# Patient Record
Sex: Female | Born: 1940 | Race: White | Hispanic: No | Marital: Single | Smoking: Former smoker
Health system: Southern US, Community
[De-identification: ages and names within clinical notes are randomized; demographics above are authoritative.]

## PROBLEM LIST (undated history)

## (undated) DIAGNOSIS — Z972 Presence of dental prosthetic device (complete) (partial): Secondary | ICD-10-CM

## (undated) DIAGNOSIS — N183 Chronic kidney disease, stage 3 unspecified: Secondary | ICD-10-CM

## (undated) DIAGNOSIS — I10 Essential (primary) hypertension: Secondary | ICD-10-CM

## (undated) DIAGNOSIS — K439 Ventral hernia without obstruction or gangrene: Secondary | ICD-10-CM

## (undated) DIAGNOSIS — Z9109 Other allergy status, other than to drugs and biological substances: Secondary | ICD-10-CM

## (undated) DIAGNOSIS — E0590 Thyrotoxicosis, unspecified without thyrotoxic crisis or storm: Secondary | ICD-10-CM

## (undated) HISTORY — PX: BLADDER REPAIR: SHX76

## (undated) HISTORY — PX: ABDOMINAL HYSTERECTOMY: SHX81

## (undated) HISTORY — PX: OTHER SURGICAL HISTORY: SHX169

---

## 2014-12-09 ENCOUNTER — Encounter: Payer: Self-pay | Admitting: Emergency Medicine

## 2014-12-09 ENCOUNTER — Ambulatory Visit
Admission: EM | Admit: 2014-12-09 | Discharge: 2014-12-09 | Disposition: A | Discharge status: Home / Self Care | Payer: 59 | Attending: Family Medicine | Admitting: Family Medicine

## 2014-12-09 DIAGNOSIS — E860 Dehydration: Secondary | ICD-10-CM | POA: Diagnosis not present

## 2014-12-09 DIAGNOSIS — N390 Urinary tract infection, site not specified: Secondary | ICD-10-CM

## 2014-12-09 DIAGNOSIS — K529 Noninfective gastroenteritis and colitis, unspecified: Secondary | ICD-10-CM

## 2014-12-09 HISTORY — DX: Other allergy status, other than to drugs and biological substances: Z91.09

## 2014-12-09 HISTORY — DX: Ventral hernia without obstruction or gangrene: K43.9

## 2014-12-09 LAB — URINALYSIS COMPLETE WITH MICROSCOPIC (ARMC ONLY)
GLUCOSE, UA: NEGATIVE mg/dL
NITRITE: NEGATIVE
Protein, ur: 100 mg/dL — AB
SQUAMOUS EPITHELIAL / LPF: NONE SEEN — AB
Specific Gravity, Urine: 1.025 (ref 1.005–1.030)
pH: 5.5 (ref 5.0–8.0)

## 2014-12-09 MED ORDER — BISMUTH SUBSALICYLATE 262 MG/15ML PO SUSP: 30.0000 mL | Freq: Four times a day (QID) | ORAL | Status: DC | PRN

## 2014-12-09 MED ORDER — NITROFURANTOIN MONOHYD MACRO 100 MG PO CAPS: 100.0000 mg | ORAL_CAPSULE | Freq: Two times a day (BID) | ORAL | Status: DC

## 2014-12-09 NOTE — ED Notes (Signed)
Pt reports she feels a little better after drinking water, provided pedialyte and crackers for PO challenge. Pt tolerating drinking fluids.

## 2014-12-09 NOTE — ED Notes (Signed)
Pt provided 1st and 2nd cup of water to drink.

## 2014-12-09 NOTE — ED Notes (Signed)
Pt reports thinks ate bad food, stomach pain, vomiting on Friday. Today off and on low grade fever, no energy, hasn't eaten, "feels sick".

## 2014-12-09 NOTE — Discharge Instructions (Signed)
Asymptomatic Bacteriuria Asymptomatic bacteriuria is the presence of a large number of bacteria in your urine without the usual symptoms of burning or frequent urination. The following conditions increase the risk of asymptomatic bacteriuria:  Diabetes mellitus.  Advanced age.  Pregnancy in the first trimester.  Kidney stones.  Kidney transplants.  Leaky kidney tube valve in young children (reflux). Treatment for this condition is not needed in most people and can lead to other problems such as too much yeast and growth of resistant bacteria. However, some people, such as pregnant women, do need treatment to prevent kidney infection. Asymptomatic bacteriuria in pregnancy is also associated with fetal growth restriction, premature labor, and newborn death. HOME CARE INSTRUCTIONS Monitor your condition for any changes. The following actions may help to relieve any discomfort you are feeling:  Drink enough water and fluids to keep your urine clear or pale yellow. Go to the bathroom more often to keep your bladder empty.  Keep the area around your vagina and rectum clean. Wipe yourself from front to back after urinating. SEEK IMMEDIATE MEDICAL CARE IF:  You develop signs of an infection such as:  Burning with urination.  Frequency of voiding.  Back pain.  Fever.  You have blood in the urine.  You develop a fever. MAKE SURE YOU:  Understand these instructions.  Will watch your condition.  Will get help right away if you are not doing well or get worse. Document Released: 03/29/2005 Document Revised: 08/13/2013 Document Reviewed: 09/18/2012 [facility] Patient Information 2015 [place], [place]. This information is not intended to replace advice given to you by your health care provider. Make sure you discuss any questions you have with your health care provider. Food Poisoning Food poisoning is an illness caused by something you ate or drank. There are over 250 known causes of  food poisoning. However, many other causes are unknown.You can be treated even if the exact cause of your food poisoning is not known. In most cases, food poisoning is mild and lasts 1 to 2 days. However, some cases can be serious, especially for people with low immune systems, the elderly, children and infants, and pregnant women. CAUSES  Poor personal hygiene, improper cleaning of storage and preparation areas, and unclean utensils can cause infection or tainting (contamination) of foods. The causes of food poisoning are numerous.Infectious agents, such as viruses, bacteria, or parasites, can cause harm by infecting the intestine and disrupting the absorption of nutrients and water. This can cause diarrhea and lead to dehydration. Viruses are responsible for most of the food poisonings in which an agent is found. Parasites are less likely to cause food poisoning. Toxic agents, such as poisonous mushrooms, marine algae, and pesticides can also cause food poisoning.  Viral causes of food poisoning include:  Norovirus.  Rotavirus.  Hepatitis A.  Bacterial causes of food poisoning include:  Salmonellae.  Campylobacter.  Bacillus cereus.  Escherichia coli (E. coli).  Shigella.  Listeria monocytogenes.  Clostridium botulinum (botulism).  Vibrio cholerae.  Parasites that can cause food poisoning include:  Giardia.  Cryptosporidium.  Toxoplasma. SYMPTOMS Symptoms may appear several hours or longer after consuming the contaminated food or drink. Symptoms may include:  Nausea.  Vomiting.  Cramping.  Diarrhea.  Fever and chills.  Muscle aches. DIAGNOSIS Your health care provider may be able to diagnose food poisoning from a list of what you have recently eaten and results from lab tests. Diagnostic tests may include an exam of the feces. TREATMENT In most cases, treatment  focuses on helping to relieve your symptoms and staying well hydrated. Antibiotic medicines are  rarely needed. In severe cases, hospitalization may be required. HOME CARE INSTRUCTIONS   Drink enough water and fluids to keep your urine clear or pale yellow. Drink small amounts of fluids frequently and increase as tolerated.  Ask your health care provider for specific rehydration instructions.  Avoid:  Foods high in sugar.  Alcohol.  Carbonated drinks.  Tobacco.  Juice.  Caffeine drinks.  Extremely hot or cold fluids.  Fatty, greasy foods.  Too much intake of anything at one time.  Dairy products until 24 to 48 hours after diarrhea stops.  You may consume probiotics. Probiotics are active cultures of beneficial bacteria. They may lessen the amount and number of diarrheal stools in adults. Probiotics can be found in yogurt with active cultures and in supplements.  Wash your hands well to avoid spreading the bacteria.  Take medicines only as directed by your health care provider. Do not give your child aspirin because of the association with Reye's syndrome.  Ask your health care provider if you should continue to take your regular prescribed and over-the-counter medicines. PREVENTION   Wash your hands, food preparation surfaces, and utensils thoroughly before and after handling raw foods.  Keep refrigerated foods below 39F (5C).  Serve hot foods immediately or keep them heated above 139F (60C).  Divide large volumes of food into small portions for rapid cooling in the refrigerator. Hot, bulky foods in the refrigerator can raise the temperature of other foods that have already cooled.  Follow approved canning procedures.  Heat canned foods thoroughly before tasting.  When in doubt, throw it out.  Infants, the elderly, women who are pregnant, and people with compromised immune systems are especially susceptible to food poisoning. These people should never consume unpasteurized cheese, unpasteurized cider, raw fish, raw seafood, or raw meat-type products. SEEK  IMMEDIATE MEDICAL CARE IF:   You have difficulty breathing, swallowing, talking, or moving.  You develop blurred vision.  You are unable to keep fluids down.  You faint or nearly faint.  Your eyes turn yellow.  Vomiting or diarrhea develops or becomes persistent.  Abdominal pain develops, increases, or localizes in one small area.  You have a fever.  The diarrhea becomes excessive or contains blood or mucus.  You develop excessive weakness, dizziness, or extreme thirst.  You have no urine for 8 hours. MAKE SURE YOU:   Understand these instructions.  Will watch your condition.  Will get help right away if you are not doing well or get worse. Document Released: 12/26/2003 Document Revised: 08/13/2013 Document Reviewed: 08/13/2010 [facility] Patient Information 2015 [place], [place]. This information is not intended to replace advice given to you by your health care provider. Make sure you discuss any questions you have with your health care provider. Viral Gastroenteritis Viral gastroenteritis is also known as stomach flu. This condition affects the stomach and intestinal tract. It can cause sudden diarrhea and vomiting. The illness typically lasts 3 to 8 days. Most people develop an immune response that eventually gets rid of the virus. While this natural response develops, the virus can make you quite ill. CAUSES  Many different viruses can cause gastroenteritis, such as rotavirus or noroviruses. You can catch one of these viruses by consuming contaminated food or water. You may also catch a virus by sharing utensils or other personal items with an infected person or by touching a contaminated surface. SYMPTOMS  The most common symptoms  are diarrhea and vomiting. These problems can cause a severe loss of body fluids (dehydration) and a body salt (electrolyte) imbalance. Other symptoms may include:  Fever.  Headache.  Fatigue.  Abdominal pain. DIAGNOSIS  Your caregiver can  usually diagnose viral gastroenteritis based on your symptoms and a physical exam. A stool sample may also be taken to test for the presence of viruses or other infections. TREATMENT  This illness typically goes away on its own. Treatments are aimed at rehydration. The most serious cases of viral gastroenteritis involve vomiting so severely that you are not able to keep fluids down. In these cases, fluids must be given through an intravenous line (IV). HOME CARE INSTRUCTIONS   Drink enough fluids to keep your urine clear or pale yellow. Drink small amounts of fluids frequently and increase the amounts as tolerated.  Ask your caregiver for specific rehydration instructions.  Avoid:  Foods high in sugar.  Alcohol.  Carbonated drinks.  Tobacco.  Juice.  Caffeine drinks.  Extremely hot or cold fluids.  Fatty, greasy foods.  Too much intake of anything at one time.  Dairy products until 24 to 48 hours after diarrhea stops.  You may consume probiotics. Probiotics are active cultures of beneficial bacteria. They may lessen the amount and number of diarrheal stools in adults. Probiotics can be found in yogurt with active cultures and in supplements.  Wash your hands well to avoid spreading the virus.  Only take over-the-counter or prescription medicines for pain, discomfort, or fever as directed by your caregiver. Do not give aspirin to children. Antidiarrheal medicines are not recommended.  Ask your caregiver if you should continue to take your regular prescribed and over-the-counter medicines.  Keep all follow-up appointments as directed by your caregiver. SEEK IMMEDIATE MEDICAL CARE IF:   You are unable to keep fluids down.  You do not urinate at least once every 6 to 8 hours.  You develop shortness of breath.  You notice blood in your stool or vomit. This may look like coffee grounds.  You have abdominal pain that increases or is concentrated in one small area  (localized).  You have persistent vomiting or diarrhea.  You have a fever.  The patient is a child younger than 3 months, and he or she has a fever.  The patient is a child older than 3 months, and he or she has a fever and persistent symptoms.  The patient is a child older than 3 months, and he or she has a fever and symptoms suddenly get worse.  The patient is a baby, and he or she has no tears when crying. MAKE SURE YOU:   Understand these instructions.  Will watch your condition.  Will get help right away if you are not doing well or get worse. Document Released: 03/29/2005 Document Revised: 06/21/2011 Document Reviewed: 01/13/2011 [facility] Patient Information 2015 [place], [place]. This information is not intended to replace advice given to you by your health care provider. Make sure you discuss any questions you have with your health care provider.

## 2014-12-09 NOTE — ED Provider Notes (Signed)
CSN: [id number]     Arrival date & time 12/09/14  0900 History   First MD Initiated Contact with Patient 12/09/14 1021     No chief complaint on file.  (Consider location/radiation/quality/duration/timing/severity/associated sxs/prior Treatment) HPI Comments: Single caucasian female here for evaluation of diarrhea over weekend, dizziness, decreased appetite, no energy fever tmax 101.9 over the weekend.  On Friday 26 August ate a healthy choice frozen meal she has eaten in the past without problems and for lunch had salad of lettuce, tomato, chicken and cucumber at 1730 started to feel sick to stomach 2130 pains only tolerated liquids all weekend bought ensure Sunday 28 Aug and was able to tolerate it but still dizzy and decreased appetite today.  Temperature fluctuates between 101 and 98 to 99.  Body aches, sweats, chills and year-round allergies.  Currently no PCM as previous one [place].  The history is provided by the patient.    Past Medical History  Diagnosis Date  . Environmental allergies   . Hernia of abdominal wall    Past Surgical History  Procedure Laterality Date  . Cesarean section    . Large bowel surgery    . Bladder repair     History reviewed. No pertinent family history. Social History  Substance Use Topics  . Smoking status: Former Smoker    Quit date: 12/08/1973  . Smokeless tobacco: None  . Alcohol Use: No   OB History    No data available     Review of Systems  Constitutional: Positive for fever, chills, diaphoresis, activity change, appetite change and fatigue.  HENT: Negative for congestion, dental problem, drooling, ear discharge, ear pain, facial swelling, hearing loss, mouth sores, nosebleeds, postnasal drip, rhinorrhea, sinus pressure, sneezing, sore throat, tinnitus, trouble swallowing and voice change.   Eyes: Negative for photophobia, pain, discharge, redness, itching and visual disturbance.  Respiratory: Negative for cough,  chest tightness, shortness of breath, wheezing and stridor.   Cardiovascular: Negative for chest pain, palpitations and leg swelling.  Gastrointestinal: Negative for nausea, vomiting, abdominal pain, diarrhea, constipation, blood in stool and abdominal distention.  Endocrine: Negative for cold intolerance and heat intolerance.  Genitourinary: Negative for dysuria, frequency and hematuria.  Musculoskeletal: Positive for myalgias. Negative for back pain, joint swelling, arthralgias, gait problem, neck pain and neck stiffness.  Skin: Negative for color change, pallor, rash and wound.  Allergic/Immunologic: Positive for environmental allergies and food allergies. Negative for immunocompromised state.  Neurological: Positive for dizziness and weakness. Negative for tremors, seizures, syncope, facial asymmetry, speech difficulty, light-headedness, numbness and headaches.  Hematological: Negative for adenopathy. Does not bruise/bleed easily.  Psychiatric/Behavioral: Negative for behavioral problems, confusion, sleep disturbance and agitation.    Allergies  Fish allergy  Home Medications   Prior to Admission medications   Medication Sig Start Date End Date Taking? Authorizing Provider  bismuth subsalicylate (PEPTO-BISMOL) 262 MG/15ML suspension Take 30 mLs by mouth every 6 (six) hours as needed. 12/09/14   Barbaraann Barthel, NP  nitrofurantoin, macrocrystal-monohydrate, (MACROBID) 100 MG capsule Take 1 capsule (100 mg total) by mouth 2 (two) times daily. 12/09/14   Barbaraann Barthel, NP   Meds Ordered and Administered this Visit  Medications - No data to display  BP 133/68 mmHg  Pulse 94  Temp(Src) 98.2 F (36.8 C) (Oral)  Resp 16  Ht 5\' 1"  (1.549 m)  Wt 130 lb (58.968 kg)  BMI 24.58 kg/m2  SpO2 98% Orthostatic VS for the past 24 hrs:  BP- Lying  Pulse- Lying BP- Sitting Pulse- Sitting BP- Standing at 0 minutes Pulse- Standing at 0 minutes  12/09/14 1021 144/66 mmHg 86 119/88 mmHg 93 120/67  mmHg 101    Physical Exam  Constitutional: She is oriented to person, place, and time. Vital signs are normal. She appears well-developed and well-nourished. No distress.  HENT:  Head: Normocephalic and atraumatic.  Right Ear: External ear normal.  Left Ear: External ear normal.  Nose: Nose normal.  Mouth/Throat: Oropharynx is clear and moist. No oropharyngeal exudate.  Eyes: Conjunctivae, EOM and lids are normal. Pupils are equal, round, and reactive to light. Right eye exhibits no discharge. Left eye exhibits no discharge. No scleral icterus.  Neck: Trachea normal and normal range of motion. Neck supple. No tracheal deviation present.  Cardiovascular: Normal rate, regular rhythm, normal heart sounds and intact distal pulses.  Exam reveals no gallop and no friction rub.   No murmur heard. Pulmonary/Chest: Effort normal and breath sounds normal. No stridor. No respiratory distress. She has no wheezes. She has no rales.  Abdominal: Soft. Bowel sounds are normal. She exhibits no shifting dullness, no distension, no pulsatile liver, no fluid wave, no abdominal bruit, no ascites and no mass. There is no hepatosplenomegaly. There is no tenderness. There is no rigidity, no rebound, no guarding, no tenderness at McBurney's point and negative Murphy's sign. Hernia confirmed negative in the ventral area.  Dull to percussion x 4 quads  Musculoskeletal: Normal range of motion. She exhibits no edema or tenderness.  Lymphadenopathy:    She has no cervical adenopathy.  Neurological: She is alert and oriented to person, place, and time. She exhibits normal muscle tone. Coordination normal.  Skin: Skin is warm and intact. No rash noted. She is not diaphoretic. No erythema. No pallor.  Psychiatric: She has a normal mood and affect. Her speech is normal and behavior is normal. Judgment and thought content normal. Cognition and memory are normal.  Nursing note and vitals reviewed.   ED Course  Procedures  (including critical care time)  Labs Review Labs Reviewed  URINALYSIS COMPLETEWITH MICROSCOPIC ([facility] ONLY) - Abnormal; Notable for the following:    Color, Urine AMBER (*)    APPearance CLOUDY (*)    Bilirubin Urine 1+ (*)    Ketones, ur TRACE (*)    Hgb urine dipstick 2+ (*)    Protein, ur 100 (*)    Leukocytes, UA 2+ (*)    Bacteria, UA FEW (*)    Squamous Epithelial / LPF NONE SEEN (*)    All other components within normal limits  URINE CULTURE    Imaging Review No results found.  1030 patient tolerated cup of water without nausea and vomiting, dizzy with position changes.  1125 patient notified urinalysis results UTI  Given copy of results.  Tolerated pedialyte in clinic.  Rx for macrobid 100mg  po BID given. Discussed urine culture results typically available in 48 hours.  Bland diet for gastroenteritis.  PO hydration resolved orthostatic hypotension. See repeat vitals.   Patient verbalized understanding of information/instructions, agreed with plan of care and had no further questions at this time.  Orthostatic VS for the past 24 hrs:  BP- Lying Pulse- Lying BP- Sitting Pulse- Sitting BP- Standing at 0 minutes Pulse- Standing at 0 minutes  12/09/14 1131 123/52 mmHg 77 123/55 mmHg 83 111/58 mmHg 85  12/09/14 1021 144/66 mmHg 86 119/88 mmHg 93 120/67 mmHg 101      MDM   1. UTI (lower urinary tract infection)  2. Gastroenteritis   3. Mild dehydration    Medications as directed. Rx macrobid  po BID x 7 days.  Patient is also to push fluids Will call with urine culture results once available typically 48 hours.  Hydrate, avoid dehydration.  Avoid holding urine void on frequent basis every 4 to 6 hours.  If unable to void every 8 hours follow up for re-evaluation with PCM, urgent care or ER.   Call or return to clinic as needed if these symptoms worsen or fail to improve as anticipated.  Exitcare handout on cystitis given to patient Patient verbalized agreement and  understanding of treatment plan and had no further questions at this time. P2:  Hydrate and cranberry juice  I have recommended clear fluids and the BRAT diet. May use OTC peptobismol if diarrhea reoccurs.  Discussed will cause stools to become black.  Avoid dairy, meat, fried and spicy foods until symptoms resolved.  Offered nausea medication and patient refused.  medications as directed.  Return to the clinic if  symptoms persist or worsen; I have alerted the patient to call if high fever, unable to urinate every 8 hours, dehydration, marked weakness, fainting, increased abdominal pain, blood in stool or vomit.  Exitcare handout on gastroenteritis, food poisoning given to patient.  Patient verbalized agreement and understanding of treatment plan and had no further questions at this time.   P2:  Hand washing and fitness  Barbaraann Barthel, NP 12/09/14 1528

## 2014-12-11 LAB — URINE CULTURE: Special Requests: NORMAL

## 2020-12-26 ENCOUNTER — Other Ambulatory Visit: Payer: Self-pay

## 2020-12-26 ENCOUNTER — Ambulatory Visit
Admission: EM | Admit: 2020-12-26 | Discharge: 2020-12-26 | Disposition: A | Discharge status: Home / Self Care | Payer: Medicare Other

## 2020-12-26 DIAGNOSIS — R21 Rash and other nonspecific skin eruption: Secondary | ICD-10-CM | POA: Diagnosis not present

## 2020-12-26 DIAGNOSIS — L299 Pruritus, unspecified: Secondary | ICD-10-CM | POA: Diagnosis not present

## 2020-12-26 DIAGNOSIS — W57XXXA Bitten or stung by nonvenomous insect and other nonvenomous arthropods, initial encounter: Secondary | ICD-10-CM

## 2020-12-26 MED ORDER — TRIAMCINOLONE ACETONIDE 0.1 % EX CREA
1.0000 | TOPICAL_CREAM | Freq: Two times a day (BID) | CUTANEOUS | 0 refills | Status: AC
Start: 2020-12-26 — End: ?

## 2020-12-26 MED ORDER — METHYLPREDNISOLONE 4 MG PO TBPK: ORAL_TABLET | ORAL | 0 refills | Status: DC

## 2020-12-26 NOTE — ED Provider Notes (Signed)
[facility]    CSN: [id number] Arrival date & time: 12/26/20  2706      History   Chief Complaint No chief complaint on file.   HPI Amber Pollard is a 80 y.o. female presenting for 3-day history of erythematous and pruritic papules of her extremities.  Patient says she went out of town to the mountains over the weekend and the rash developed shortly after she returned home.  She says she did sleep in a cabin out there.  Patient reports she continues to wake up with new bumps.  She says that she woke up today with new red bumps on her left upper arm.  She has applied alcohol and hydrocortisone cream without any improvement in condition.  Patient says it continues to get worse and spread.  She is not member come in contact with any known allergens and denies seeing any insects, specifically bedbugs but does have concern for that.  No other contacts have similar rashes.  Patient says she has washed her clothes that she wore when she was out of the mountains but has not washed her pillows.  No other complaints.  HPI  Past Medical History:  Diagnosis Date   Environmental allergies    Hernia of abdominal wall     There are no problems to display for this patient.   Past Surgical History:  Procedure Laterality Date   BLADDER REPAIR     CESAREAN SECTION     large bowel surgery      OB History   No obstetric history on file.      Home Medications    Prior to Admission medications   Medication Sig Start Date End Date Taking? Authorizing Provider  Ascorbic Acid 500 MG CHEW Chew by mouth.   Yes [provider]  bismuth subsalicylate (PEPTO-BISMOL) 262 MG/15ML suspension Take 30 mLs by mouth every 6 (six) hours as needed. 12/09/14  Yes Betancourt, Jarold Song, NP  cyanocobalamin 100 MCG tablet Take 1 tablet by mouth daily.   Yes [provider]  methimazole (TAPAZOLE) 5 MG tablet Take 2.5 mg by mouth daily. 10/23/20  Yes [provider]   methylPREDNISolone (MEDROL DOSEPAK) 4 MG TBPK tablet Take 6 tabs PO on day 1 and decrease by 1 tab daily until complete 12/26/20  Yes Eusebio Friendly B, PA-C  montelukast (SINGULAIR) 10 MG tablet Take 10 mg by mouth daily. 10/23/20  Yes [provider]  Multiple Vitamins-Minerals (MULTIVITAMIN ADULT EXTRA C PO) Take 1 tablet by mouth daily.   Yes [provider]  nitrofurantoin, macrocrystal-monohydrate, (MACROBID) 100 MG capsule Take 1 capsule (100 mg total) by mouth 2 (two) times daily. 12/09/14  Yes Betancourt, Jarold Song, NP  triamcinolone cream (KENALOG) 0.1 % Apply 1 application topically 2 (two) times daily. 12/26/20  Yes Shirlee Latch PA-C    Family History History reviewed. No pertinent family history.  Social History Social History   Tobacco Use   Smoking status: Former    Types: Cigarettes    Quit date: 12/08/1973    Years since quitting: 47.0   Smokeless tobacco: Never  Substance Use Topics   Alcohol use: No     Allergies   Fish allergy   Review of Systems Review of Systems  Constitutional:  Negative for fatigue and fever.  HENT:  Negative for congestion.   Respiratory:  Negative for cough.   Musculoskeletal:  Negative for arthralgias and joint swelling.  Skin:  Positive for rash.  Allergic/Immunologic: Positive  for environmental allergies.  Neurological:  Negative for weakness.    Physical Exam Triage Vital Signs ED Triage Vitals  Enc Vitals Group     BP 12/26/20 0955 (!) 154/69     Pulse Rate 12/26/20 0955 83     Resp 12/26/20 0955 18     Temp 12/26/20 0955 98.1 F (36.7 C)     Temp Source 12/26/20 0955 Oral     SpO2 12/26/20 0955 96 %     Weight 12/26/20 0952 135 lb (61.2 kg)     Height 12/26/20 0952 5\' 1"  (1.549 m)     Head Circumference --      Peak Flow --      Pain Score 12/26/20 0951 0     Pain Loc --      Pain Edu? --      Excl. in GC? --    No data found.  Updated Vital Signs BP (!) 154/69 (BP Location: Left Arm)   Pulse  83   Temp 98.1 F (36.7 C) (Oral)   Resp 18   Ht 5\' 1"  (1.549 m)   Wt 135 lb (61.2 kg)   SpO2 96%   BMI 25.51 kg/m      Physical Exam Vitals and nursing note reviewed.  Constitutional:      General: She is not in acute distress.    Appearance: Normal appearance. She is not ill-appearing or toxic-appearing.  HENT:     Head: Normocephalic and atraumatic.  Eyes:     General: No scleral icterus.       Right eye: No discharge.        Left eye: No discharge.     Conjunctiva/sclera: Conjunctivae normal.  Cardiovascular:     Rate and Rhythm: Normal rate and regular rhythm.     Heart sounds: Normal heart sounds.  Pulmonary:     Effort: Pulmonary effort is normal. No respiratory distress.     Breath sounds: Normal breath sounds.  Musculoskeletal:     Cervical back: Neck supple.  Skin:    General: Skin is dry.     Findings: Rash (small and larger erythematous papules diffusely on upper and lower extremities) present.  Neurological:     General: No focal deficit present.     Mental Status: She is alert. Mental status is at baseline.     Motor: No weakness.     Gait: Gait normal.  Psychiatric:        Mood and Affect: Mood normal.        Behavior: Behavior normal.        Thought Content: Thought content normal.     UC Treatments / Results  Labs (all labs ordered are listed, but only abnormal results are displayed) Labs Reviewed - No data to display  EKG   Radiology No results found.  Procedures Procedures (including critical care time)  Medications Ordered in UC Medications - No data to display  Initial Impression / Assessment and Plan / UC Course  I have reviewed the triage vital signs and the nursing notes.  Pertinent labs & imaging results that were available during my care of the patient were reviewed by me and considered in my medical decision making (see chart for details).   80 y/o female presents for erythematous papular, pruritic rash x 3 days. Her  presentation today is consistent with insect bite/sting. High suspicion for bed bugs. Reviewed with patient that we can treat with topical and oral corticosteroids. Claritin non drowsy  during day and Benadryl at night for pruritis. Also reviewed cleaning all clothes, sheets, pillows in high heat or discarding them. If continued, new areas of rash, advised she call an exterminator.   Final Clinical Impressions(s) / UC Diagnoses   Final diagnoses:  Rash and nonspecific skin eruption  Insect bite, unspecified site, initial encounter  Pruritus     Discharge Instructions      -Your rash today does appear to be most consistent with bedbugs.  As we discussed you likely have brought bedbug eggs or some of the bugs back with you.  I would advise you either cleaning all of your sheets and close and pillows on high heat a couple of times or throwing them out.  I have sent in a couple medications to help but I would advise you take Claritin nondrowsy during the day for itching and Benadryl at bedtime.  You get those medications over-the-counter.  If you continue to have new bites then I will contact an exterminator.     ED Prescriptions     Medication Sig Dispense Auth. Provider   methylPREDNISolone (MEDROL DOSEPAK) 4 MG TBPK tablet Take 6 tabs PO on day 1 and decrease by 1 tab daily until complete 21 tablet Eusebio Friendly B, PA-C   triamcinolone cream (KENALOG) 0.1 % Apply 1 application topically 2 (two) times daily. 30 g Gareth Morgan      PDMP not reviewed this encounter.   Shirlee Latch, PA-C 12/26/20 1038

## 2020-12-26 NOTE — ED Triage Notes (Signed)
Patient came in today c/o rashes all over her body states she was in the mountains this weekend.has tried hydrocortisone  cream. With little relief after cream. -MB

## 2020-12-26 NOTE — Discharge Instructions (Addendum)
-  Your rash today does appear to be most consistent with bedbugs.  As we discussed you likely have brought bedbug eggs or some of the bugs back with you.  I would advise you either cleaning all of your sheets and close and pillows on high heat a couple of times or throwing them out.  I have sent in a couple medications to help but I would advise you take Claritin nondrowsy during the day for itching and Benadryl at bedtime.  You get those medications over-the-counter.  If you continue to have new bites then I will contact an exterminator.

## 2021-01-30 ENCOUNTER — Ambulatory Visit
Admission: EM | Admit: 2021-01-30 | Discharge: 2021-01-30 | Disposition: A | Discharge status: Home / Self Care | Payer: Medicare Other | Attending: Emergency Medicine | Admitting: Emergency Medicine

## 2021-01-30 ENCOUNTER — Other Ambulatory Visit: Payer: Self-pay

## 2021-01-30 ENCOUNTER — Telehealth: Payer: Self-pay | Admitting: Emergency Medicine

## 2021-01-30 DIAGNOSIS — M7712 Lateral epicondylitis, left elbow: Secondary | ICD-10-CM

## 2021-01-30 MED ORDER — MELOXICAM 7.5 MG PO TABS: 7.5000 mg | ORAL_TABLET | Freq: Every day | ORAL | 0 refills | Status: AC

## 2021-01-30 NOTE — ED Provider Notes (Signed)
[facility]  ____________________________________________  Time seen: Approximately 5:35 PM  I have reviewed the triage vital signs and the nursing notes.   HISTORY  Chief Complaint Elbow Pain (left)   Historian Patient     HPI Amber Pollard is a 80 y.o. female presents to the urgent care with left elbow pain.  Patient localizes pain to the lateral aspect of her left elbow.  She reports that pain started after she was playing golf.  She denies numbness or tingling that extends into her hand.  No similar instances of pain in the past.   Past Medical History:  Diagnosis Date   Environmental allergies    Hernia of abdominal wall      Immunizations up to date:  Yes.     Past Medical History:  Diagnosis Date   Environmental allergies    Hernia of abdominal wall     There are no problems to display for this patient.   Past Surgical History:  Procedure Laterality Date   BLADDER REPAIR     CESAREAN SECTION     large bowel surgery      Prior to Admission medications   Medication Sig Start Date End Date Taking? Authorizing Provider  Ascorbic Acid 500 MG CHEW Chew by mouth.   Yes [provider]  cyanocobalamin 100 MCG tablet Take 1 tablet by mouth daily.   Yes [provider]  meloxicam (MOBIC) 7.5 MG tablet Take 1 tablet (7.5 mg total) by mouth daily for 7 days. 01/30/21 02/06/21 Yes Pia Mau M, PA-C  methimazole (TAPAZOLE) 5 MG tablet Take 2.5 mg by mouth daily. 10/23/20  Yes [provider]  montelukast (SINGULAIR) 10 MG tablet Take 10 mg by mouth daily. 10/23/20  Yes [provider]  Multiple Vitamins-Minerals (MULTIVITAMIN ADULT EXTRA C PO) Take 1 tablet by mouth daily.   Yes [provider]  bismuth subsalicylate (PEPTO-BISMOL) 262 MG/15ML suspension Take 30 mLs by mouth every 6 (six) hours as needed. 12/09/14   Betancourt, Jarold Song, NP  methylPREDNISolone (MEDROL DOSEPAK) 4 MG TBPK tablet Take 6 tabs PO  on day 1 and decrease by 1 tab daily until complete 12/26/20   Shirlee Latch, PA-C  nitrofurantoin, macrocrystal-monohydrate, (MACROBID) 100 MG capsule Take 1 capsule (100 mg total) by mouth 2 (two) times daily. 12/09/14   Betancourt, Jarold Song, NP  triamcinolone cream (KENALOG) 0.1 % Apply 1 application topically 2 (two) times daily. 12/26/20   Shirlee Latch, PA-C    Allergies Fish allergy  History reviewed. No pertinent family history.  Social History Social History   Tobacco Use   Smoking status: Former    Types: Cigarettes    Quit date: 12/08/1973    Years since quitting: 47.1   Smokeless tobacco: Never  Substance Use Topics   Alcohol use: No     Review of Systems  Constitutional: No fever/chills Eyes:  No discharge ENT: No upper respiratory complaints. Respiratory: no cough. No SOB/ use of accessory muscles to breath Gastrointestinal:   No nausea, no vomiting.  No diarrhea.  No constipation. Musculoskeletal: Patient has left elbow pain. Skin: Negative for rash, abrasions, lacerations, ecchymosis.    ____________________________________________   PHYSICAL EXAM:  VITAL SIGNS: ED Triage Vitals  Enc Vitals Group     BP 01/30/21 1526 (!) 155/78     Pulse Rate 01/30/21 1526 82     Resp 01/30/21 1526 18     Temp 01/30/21 1526 98.5 F (36.9 C)  Temp Source 01/30/21 1526 Oral     SpO2 01/30/21 1526 98 %     Weight 01/30/21 1525 134 lb (60.8 kg)     Height 01/30/21 1525 5\' 1"  (1.549 m)     Head Circumference --      Peak Flow --      Pain Score 01/30/21 1524 6     Pain Loc --      Pain Edu? --      Excl. in GC? --      Constitutional: Alert and oriented. Well appearing and in no acute distress. Eyes: Conjunctivae are normal. PERRL. EOMI. Head: Atraumatic. ENT: Cardiovascular: Normal rate, regular rhythm. Normal S1 and S2.  Good peripheral circulation. Respiratory: Normal respiratory effort without tachypnea or retractions. Lungs CTAB. Good air entry to the  bases with no decreased or absent breath sounds Gastrointestinal: Bowel sounds x 4 quadrants. Soft and nontender to palpation. No guarding or rigidity. No distention. Musculoskeletal: Full range of motion to all extremities. No obvious deformities noted.  Lateral left elbow pain reproduced with resisted extension at the left wrist. Neurologic:  Normal for age. No gross focal neurologic deficits are appreciated.  Skin:  Skin is warm, dry and intact. No rash noted. Psychiatric: Mood and affect are normal for age. Speech and behavior are normal.   ____________________________________________   LABS (all labs ordered are listed, but only abnormal results are displayed)  Labs Reviewed - No data to display ____________________________________________  EKG   ____________________________________________  RADIOLOGY   No results found.  ____________________________________________    PROCEDURES  Procedure(s) performed:     Procedures     Medications - No data to display   ____________________________________________   INITIAL IMPRESSION / ASSESSMENT AND PLAN / ED COURSE  Pertinent labs & imaging results that were available during my care of the patient were reviewed by me and considered in my medical decision making (see chart for details).    Assessment and plan Lateral epicondylitis 80 year old female presents to the urgent care with acute left elbow pain.  History and physical exam findings consistent with lateral epicondylitis.  An Ace wrap was given for compression and patient was discharged with low-dose meloxicam as patient reports that she can tolerate anti-inflammatories.  Also recommended ice application for inflammation.  She was advised to follow-up with orthopedics as needed.      ____________________________________________  FINAL CLINICAL IMPRESSION(S) / ED DIAGNOSES  Final diagnoses:  Lateral epicondylitis of left elbow      NEW MEDICATIONS  STARTED DURING THIS VISIT:  ED Discharge Orders          Ordered    meloxicam (MOBIC) 7.5 MG tablet  Daily        01/30/21 1617                This chart was dictated using voice recognition software/Dragon. Despite best efforts to proofread, errors can occur which can change the meaning. Any change was purely unintentional.     02/01/21, PA-C 01/30/21 1737

## 2021-01-30 NOTE — ED Triage Notes (Signed)
Pt c/o pain in her left elbow, radiating up to her shoulder. Pt states pain started early yesterday and increased after playing 18 holes of golf. Pt denies any numbness/tingling, normal ROM to the wrist/hand, pain with movement of the elbow and shoulder. Pt denies swelling to the arm, any known injury.

## 2021-01-30 NOTE — Discharge Instructions (Addendum)
Take meloxicam once daily.  You can take anti-inflammatory up to 2 times a day. Apply ice to left elbow. Wear Ace wrap when playing golf for compression.

## 2021-09-29 ENCOUNTER — Emergency Department: Payer: Medicare Other

## 2021-09-29 ENCOUNTER — Other Ambulatory Visit: Payer: Self-pay

## 2021-09-29 ENCOUNTER — Emergency Department
Admission: EM | Admit: 2021-09-29 | Discharge: 2021-09-29 | Disposition: A | Discharge status: Home / Self Care | Payer: Medicare Other | Attending: Emergency Medicine | Admitting: Emergency Medicine

## 2021-09-29 ENCOUNTER — Encounter: Payer: Self-pay | Admitting: Emergency Medicine

## 2021-09-29 DIAGNOSIS — I447 Left bundle-branch block, unspecified: Secondary | ICD-10-CM | POA: Diagnosis not present

## 2021-09-29 DIAGNOSIS — R519 Headache, unspecified: Secondary | ICD-10-CM | POA: Insufficient documentation

## 2021-09-29 DIAGNOSIS — M542 Cervicalgia: Secondary | ICD-10-CM | POA: Insufficient documentation

## 2021-09-29 DIAGNOSIS — R911 Solitary pulmonary nodule: Secondary | ICD-10-CM | POA: Insufficient documentation

## 2021-09-29 DIAGNOSIS — R079 Chest pain, unspecified: Secondary | ICD-10-CM

## 2021-09-29 DIAGNOSIS — R0789 Other chest pain: Secondary | ICD-10-CM | POA: Diagnosis not present

## 2021-09-29 LAB — TROPONIN I (HIGH SENSITIVITY)
Troponin I (High Sensitivity): 5 ng/L (ref <18)
Troponin I (High Sensitivity): 6 ng/L (ref <18)

## 2021-09-29 LAB — CBC WITH DIFFERENTIAL/PLATELET
Abs Immature Granulocytes: 0.04 10*3/uL (ref 0.00–0.07)
Basophils Absolute: 0.1 10*3/uL (ref 0.0–0.1)
Basophils Relative: 0 %
Eosinophils Absolute: 0.2 10*3/uL (ref 0.0–0.5)
Eosinophils Relative: 2 %
HCT: 41.5 % (ref 36.0–46.0)
Hemoglobin: 14 g/dL (ref 12.0–15.0)
Immature Granulocytes: 0 %
Lymphocytes Relative: 9 %
Lymphs Abs: 1 10*3/uL (ref 0.7–4.0)
MCH: 29.4 pg (ref 26.0–34.0)
MCHC: 33.7 g/dL (ref 30.0–36.0)
MCV: 87.2 fL (ref 80.0–100.0)
Monocytes Absolute: 0.8 10*3/uL (ref 0.1–1.0)
Monocytes Relative: 7 %
Neutro Abs: 9.3 10*3/uL — ABNORMAL HIGH (ref 1.7–7.7)
Neutrophils Relative %: 82 %
Platelets: 238 10*3/uL (ref 150–400)
RBC: 4.76 MIL/uL (ref 3.87–5.11)
RDW: 11.9 % (ref 11.5–15.5)
WBC: 11.4 10*3/uL — ABNORMAL HIGH (ref 4.0–10.5)
nRBC: 0 % (ref 0.0–0.2)

## 2021-09-29 LAB — COMPREHENSIVE METABOLIC PANEL
ALT: 21 U/L (ref 0–44)
AST: 26 U/L (ref 15–41)
Albumin: 4.2 g/dL (ref 3.5–5.0)
Alkaline Phosphatase: 86 U/L (ref 38–126)
Anion gap: 7 (ref 5–15)
BUN: 16 mg/dL (ref 8–23)
CO2: 27 mmol/L (ref 22–32)
Calcium: 9.8 mg/dL (ref 8.9–10.3)
Chloride: 102 mmol/L (ref 98–111)
Creatinine, Ser: 0.8 mg/dL (ref 0.44–1.00)
GFR, Estimated: >60 mL/min (ref >60)
Glucose, Bld: 133 mg/dL — ABNORMAL HIGH (ref 70–99)
Potassium: 4.1 mmol/L (ref 3.5–5.1)
Sodium: 136 mmol/L (ref 135–145)
Total Bilirubin: 0.6 mg/dL (ref 0.3–1.2)
Total Protein: 8 g/dL (ref 6.5–8.1)

## 2021-09-29 IMAGING — MR MR MRA NECK WO/W CM
3 of 4 series · 33 of 48 positions shown · IV contrast (6ml Gadavist)
Comparison: None Available.

CLINICAL DATA: Headache, new or worsening (Age >= 50y) severe pain
in head neck and upper chest, worse w/ deep breathing movement.;
Vertebral artery dissection suspected

EXAM:
MRA NECK WITHOUT AND WITH CONTRAST
MRA HEAD WITHOUT CONTRAST
TECHNIQUE: Multiplanar and multiecho pulse sequences of the neck were obtained
without and with intravenous contrast. Angiographic images of the
neck were obtained using MRA technique without and with intravenous
contrast; Angiographic images of the Circle of Willis were obtained
using MRA technique without intravenous contrast.
CONTRAST:  6mL GADAVIST GADOBUTROL 1 MMOL/ML IV SOLN

[Series 11: angio_fl3d_cor_post_ttc=2.0s · coronal · B · 0.9mm · 0.85mm/px · 11 of 96 slices shown]
[im 1/96]
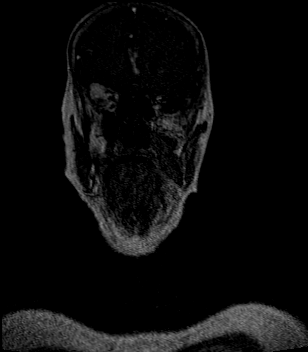
[im 10/96]
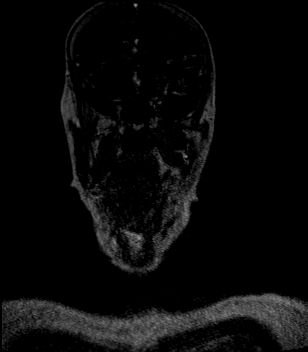
[im 20/96]
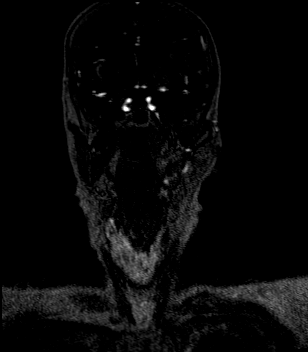
[im 29/96]
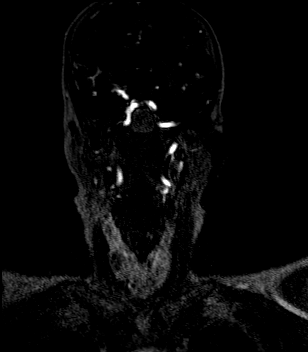
[im 39/96]
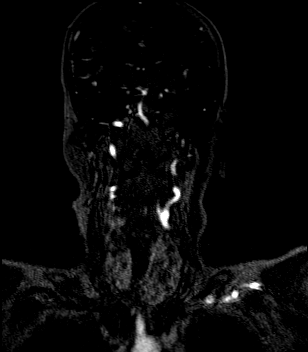
[im 48/96]
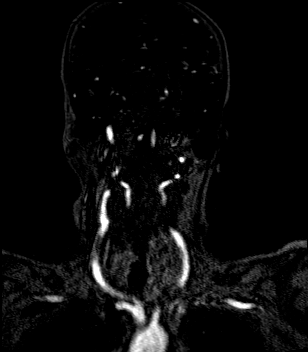
[im 58/96]
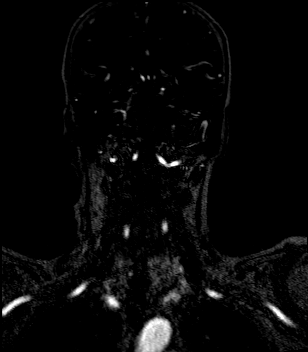
[im 67/96]
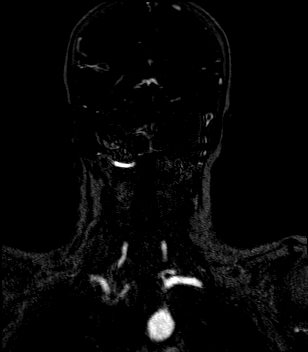
[im 77/96]
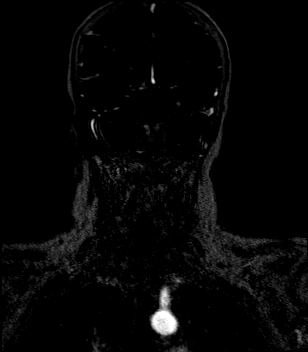
[im 86/96]
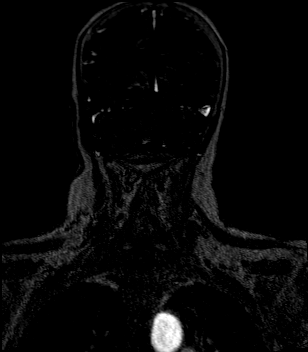
[im 96/96]
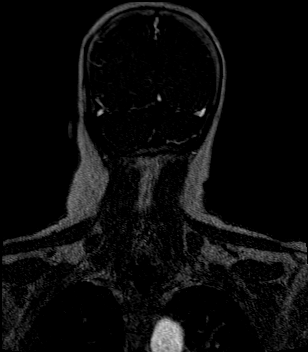

[Series 12: angio_fl3d_cor_post_ttc=2.0s_moco-adv · coronal · B · 0.9mm · 0.85mm/px · 11 of 96 slices shown]
[im 1/96]
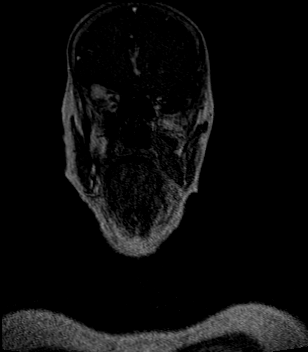
[im 10/96]
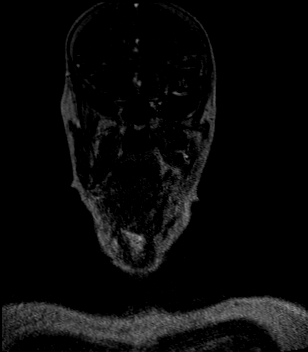
[im 20/96]
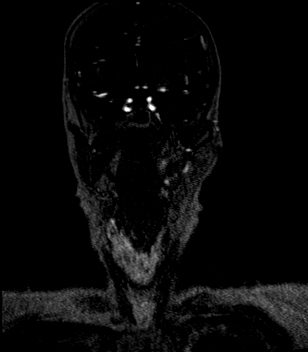
[im 29/96]
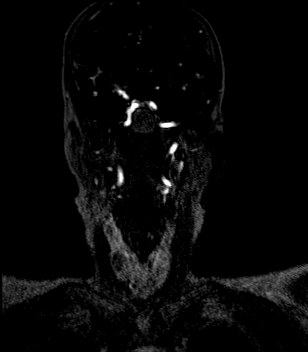
[im 39/96]
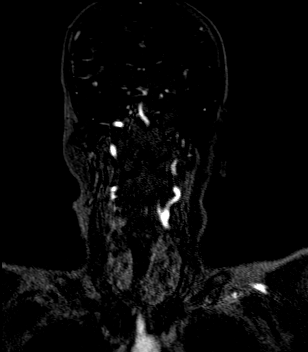
[im 48/96]
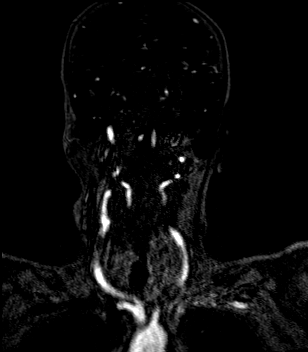
[im 58/96]
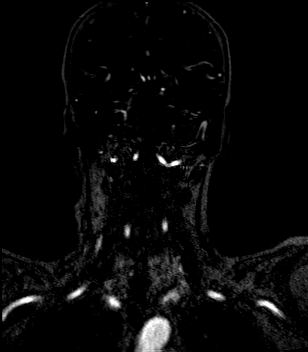
[im 67/96]
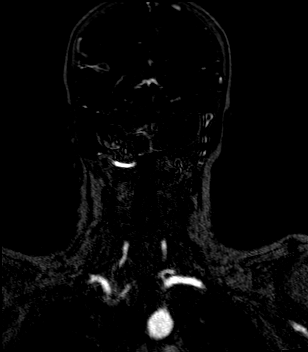
[im 77/96]
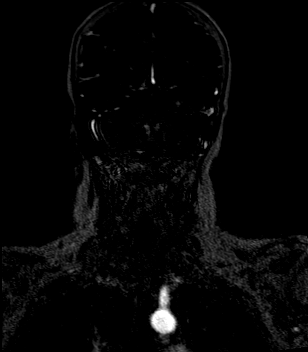
[im 86/96]
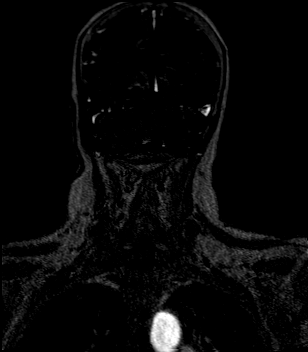
[im 96/96]
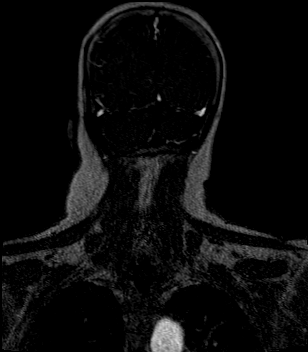

[Series 13: angio_fl3d_cor_post_ttc=2.0s_moco-adv_sub · coronal · B · 0.9mm · 0.85mm/px · 11 of 96 slices shown]
[im 1/96]
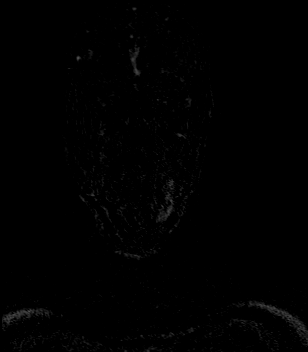
[im 10/96]
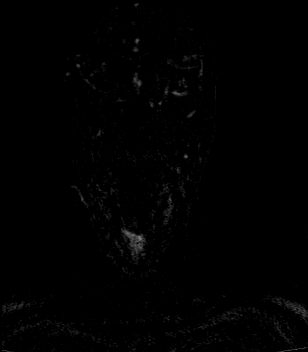
[im 20/96]
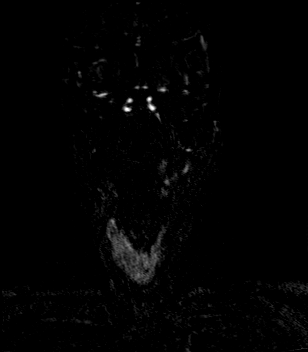
[im 29/96]
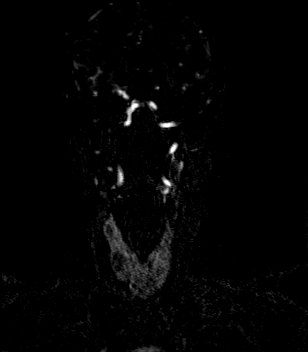
[im 39/96]
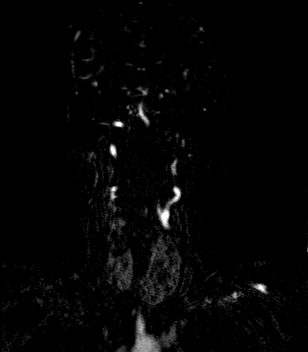
[im 48/96]
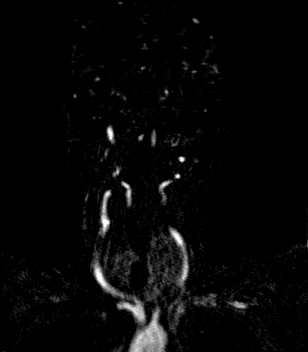
[im 58/96]
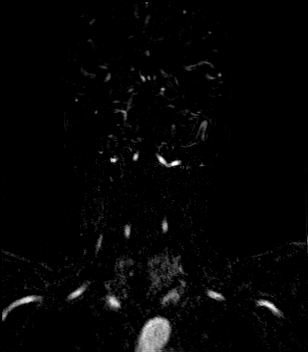
[im 67/96]
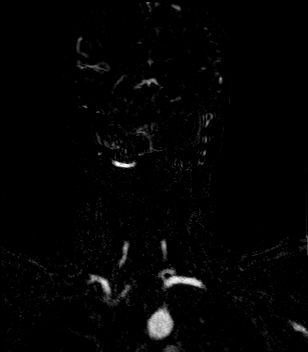
[im 77/96]
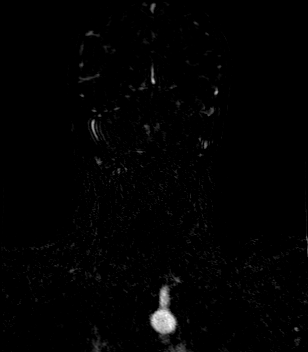
[im 86/96]
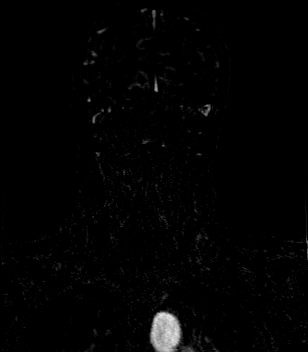
[im 96/96]
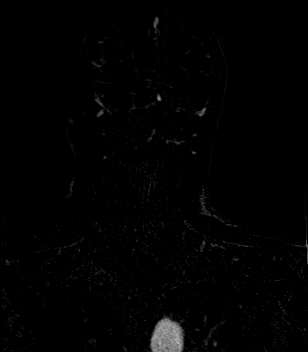

[33 of 48 positions shown; findings below may reference images not displayed]

FINDINGS: MRA NECK FINDINGS

Visualized arch is unremarkable. Great vessel origins are patent.
Common, internal, and external carotid arteries are patent.
Extracranial vertebral arteries are patent. Vertebral origins are
not well evaluated. The vessels are codominant and without stenosis.

MRA HEAD FINDINGS

Intracranial internal carotid arteries are patent with mild
atherosclerotic irregularity. Anterior cerebral arteries are patent.
Anterior communicating artery is present. Middle cerebral arteries
are patent. Intracranial vertebral arteries, basilar artery, and
posterior cerebral arteries are patent. No significant stenosis or
aneurysm identified.
IMPRESSION: No large vessel occlusion, hemodynamically significant stenosis, or
evidence of dissection.

## 2021-09-29 IMAGING — CT CT ANGIO CHEST
2 of 6 series · 17 of 46 positions shown · IV contrast (APPLIED)
Comparison: None Available.

CLINICAL DATA: Chest wall pain

EXAM:
CT ANGIOGRAPHY CHEST WITH CONTRAST
TECHNIQUE: Multidetector CT imaging of the chest was performed using the
standard protocol during bolus administration of intravenous
contrast. Multiplanar CT image reconstructions and MIPs were
obtained to evaluate the vascular anatomy.

[Series 6: thins · axial · 0.69mm/px · z∈[-748,-527]mm · 14 of 345 slices shown]
[im 15/345  lung]
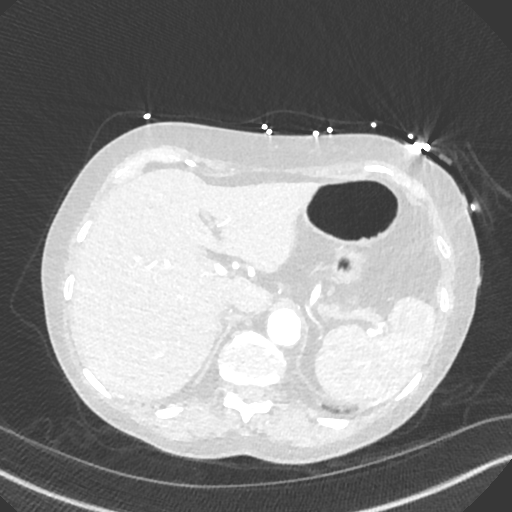
[im 45/345  soft-tissue]
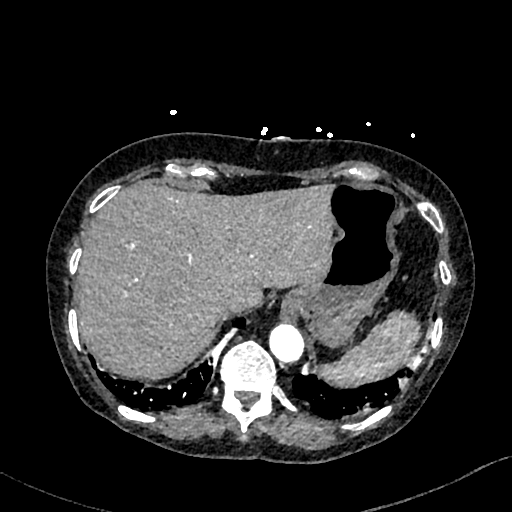
[im 60/345  lung]
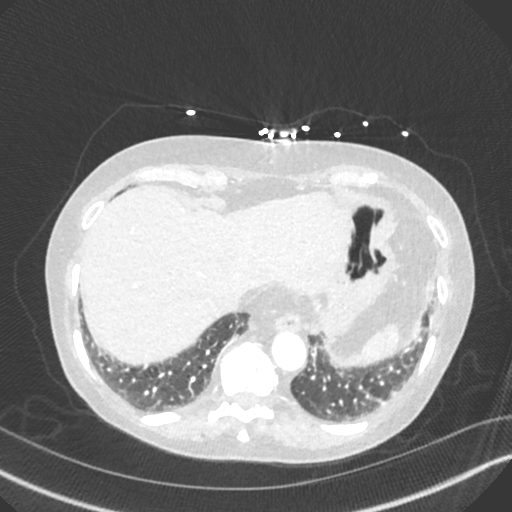
[im 90/345  soft-tissue]
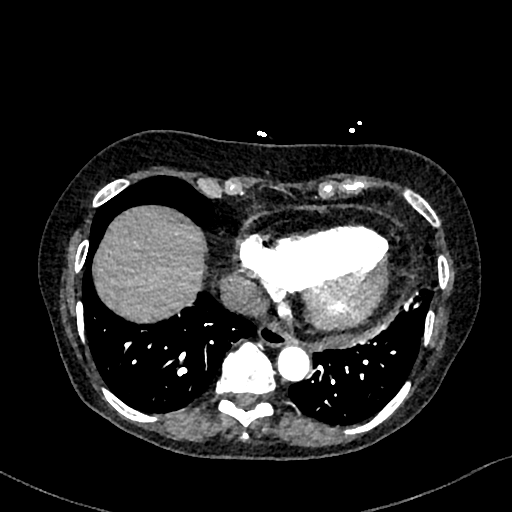
[im 120/345  lung]
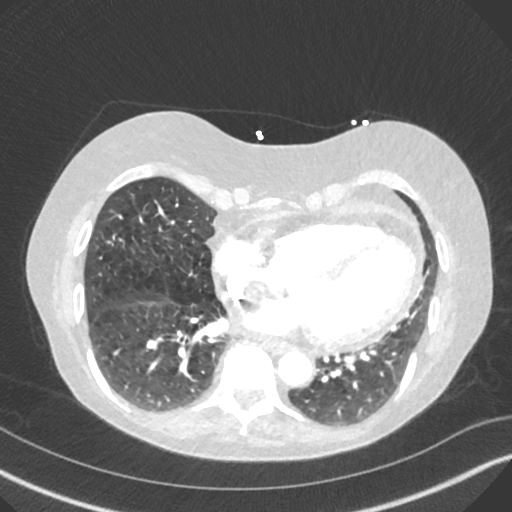
[im 135/345  soft-tissue]
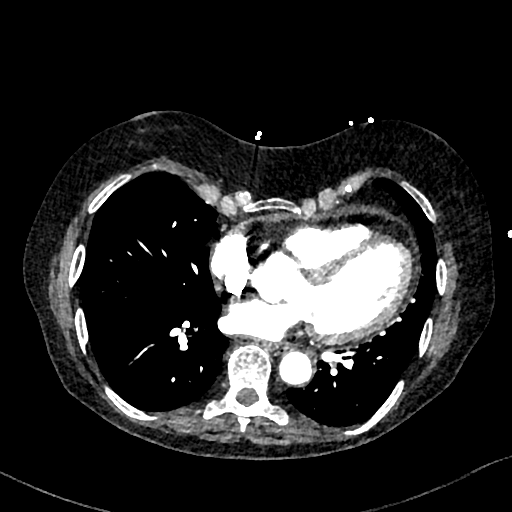
[im 165/345  lung]
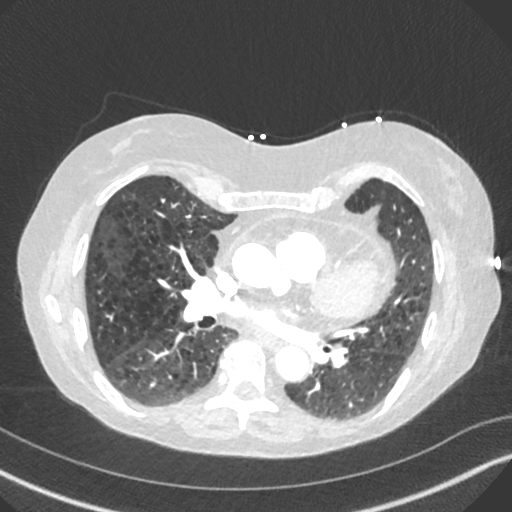
[im 180/345  soft-tissue]
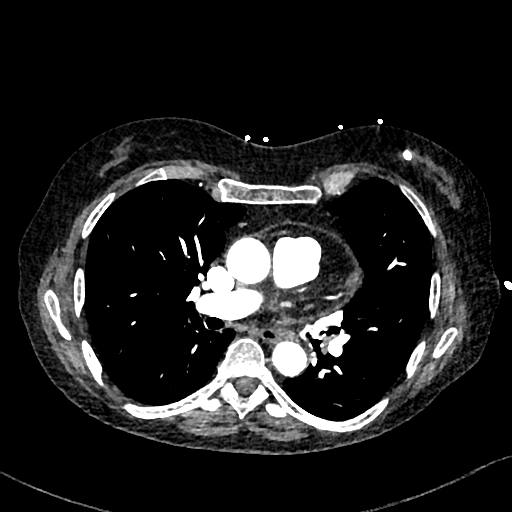
[im 210/345  lung]
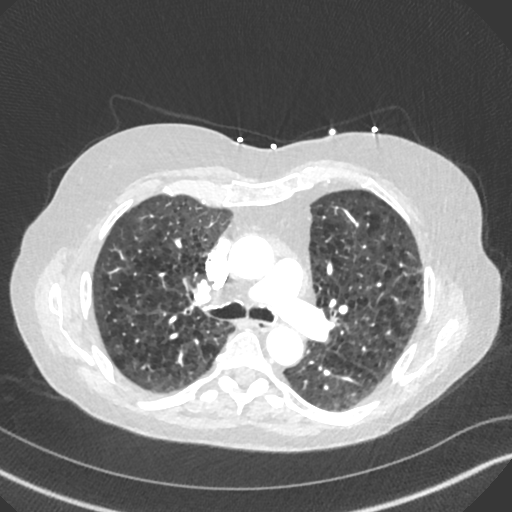
[im 225/345  soft-tissue]
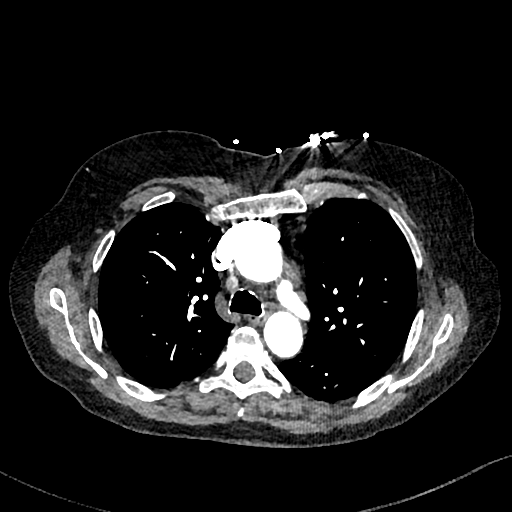
[im 255/345  lung]
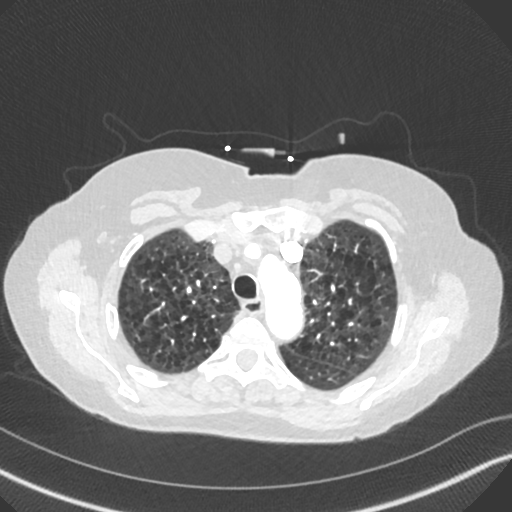
[im 285/345  soft-tissue]
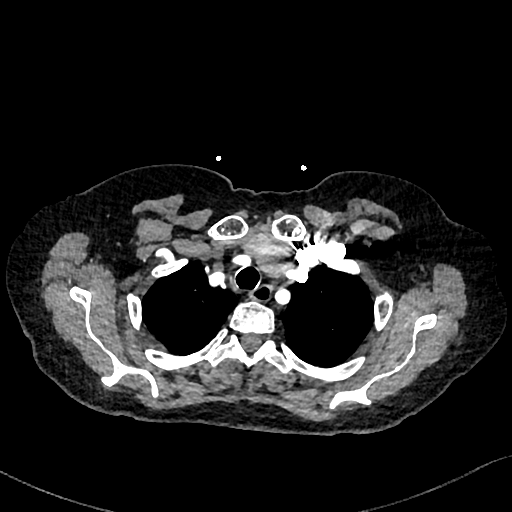
[im 300/345  lung]
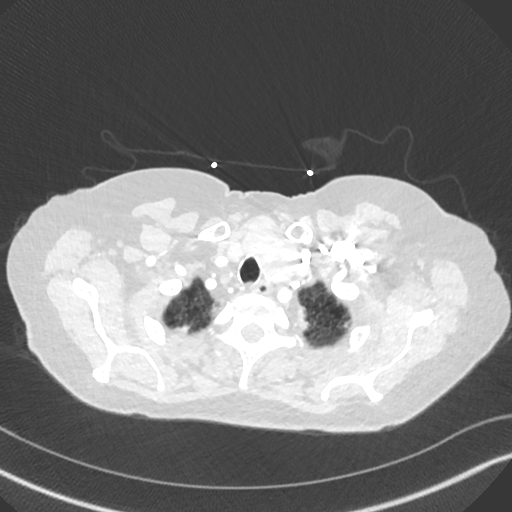
[im 330/345  soft-tissue]
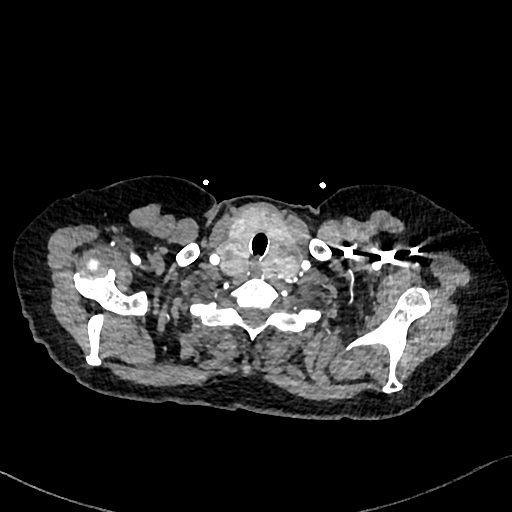

[Series 7: cor · coronal · 0.47mm/px · 3 of 105 slices shown]
[im 27/105  soft-tissue]
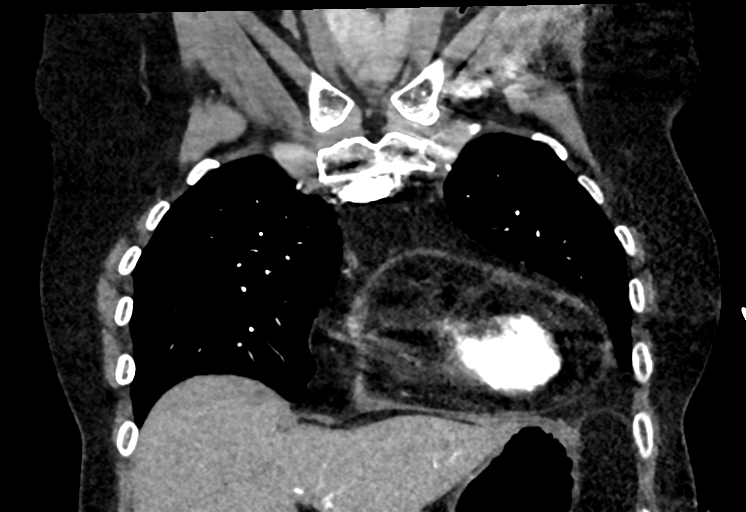
[im 53/105  soft-tissue]
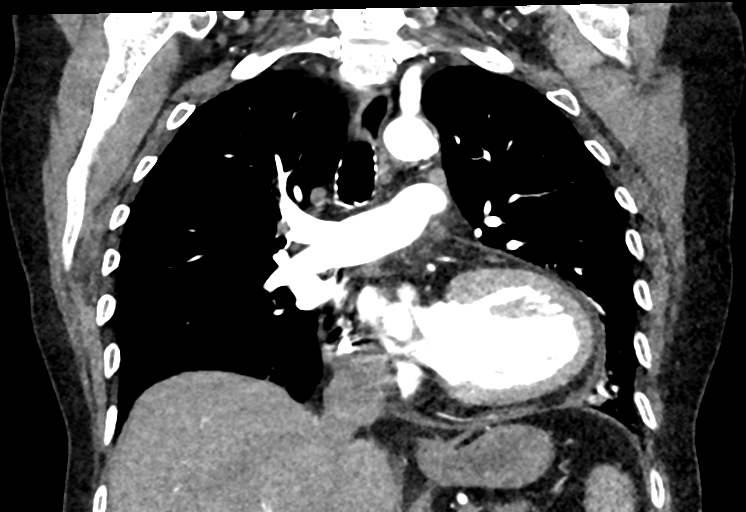
[im 79/105  soft-tissue]
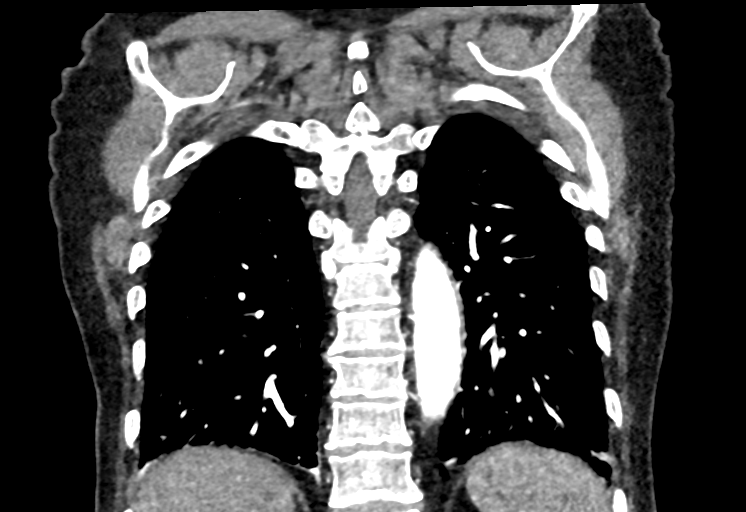

[17 of 46 positions shown; findings below may reference images not displayed]

RADIATION DOSE REDUCTION: This exam was performed according to the
departmental dose-optimization program which includes automated
exposure control, adjustment of the mA and/or kV according to
patient size and/or use of iterative reconstruction technique.

CONTRAST:  75mL OMNIPAQUE IOHEXOL 350 MG/ML SOLN
FINDINGS: Cardiovascular: Satisfactory opacification of the pulmonary arteries
to the segmental level. No evidence of pulmonary embolism. Normal
heart size. No pericardial effusion. Scattered coronary
calcification in the left main and LAD.

Mediastinum/Nodes: Negative for adenopathy or mass. Goiter, thyroid
ultrasound was performed last month.

Lungs/Pleura: Generalized emphysema. Mild atelectasis or scarring at
the lung bases. There is no edema, consolidation, effusion, or
pneumothorax. Subpleural nodule at the right middle lobe measuring 5
mm average diameter.

Upper Abdomen: No acute finding

Musculoskeletal: No acute finding

Review of the MIP images confirms the above findings.
IMPRESSION: 1. Negative for pulmonary embolism or other acute finding.
2. Emphysema and coronary atherosclerosis.
3. 5 mm pulmonary nodule in the right middle lobe. Although likely
benign, given the patient is high-risk a non-contrast chest CT can
be considered in 12 months.This recommendation follows the consensus
statement: Guidelines for Management of Incidental Pulmonary Nodules
Detected on CT Images: From the [HOSPITAL] [DL]; Radiology

## 2021-09-29 IMAGING — MR MR MRA HEAD W/O CM
1 series · 27 of 48 positions shown · IV contrast (gadavist)
Comparison: None Available.

CLINICAL DATA: Headache, new or worsening (Age >= 50y) severe pain
in head neck and upper chest, worse w/ deep breathing movement.;
Vertebral artery dissection suspected

EXAM:
MRA NECK WITHOUT AND WITH CONTRAST
MRA HEAD WITHOUT CONTRAST
TECHNIQUE: Multiplanar and multiecho pulse sequences of the neck were obtained
without and with intravenous contrast. Angiographic images of the
neck were obtained using MRA technique without and with intravenous
contrast; Angiographic images of the Circle of Willis were obtained
using MRA technique without intravenous contrast.
CONTRAST:  6mL GADAVIST GADOBUTROL 1 MMOL/ML IV SOLN

[Series 9: TOF · axial · 0.5mm · 0.48mm/px · z∈[-4,+93]mm · 27 of 217 slices shown]
[im 1/217]
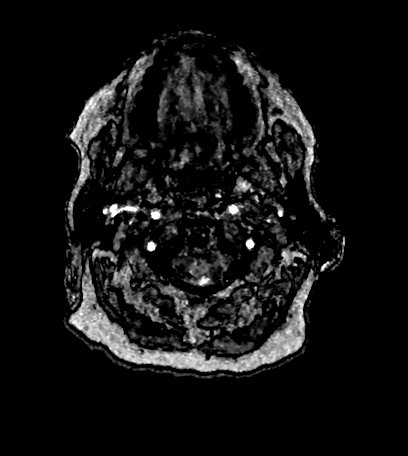
[im 5/217]
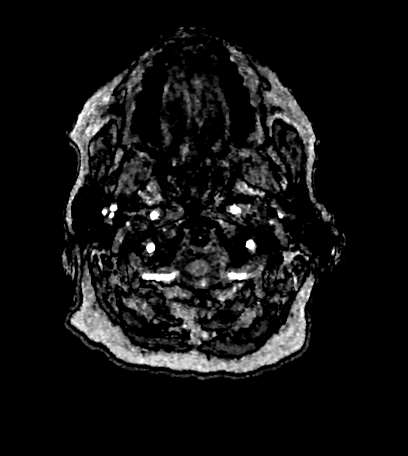
[im 10/217]
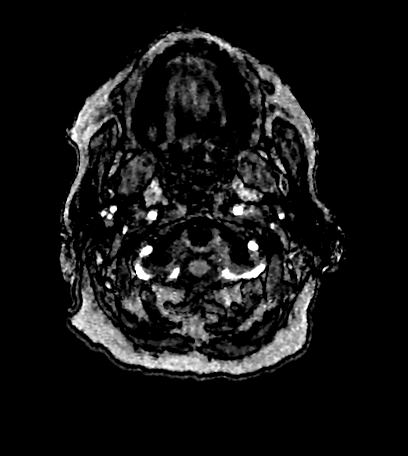
[im 14/217]
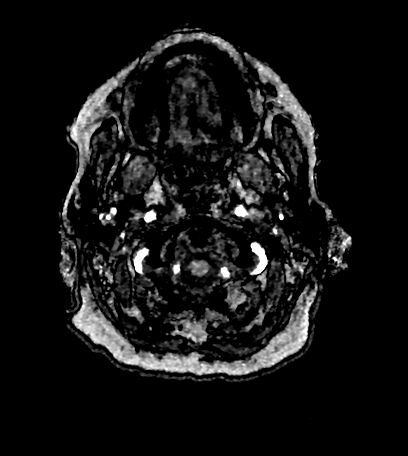
[im 19/217]
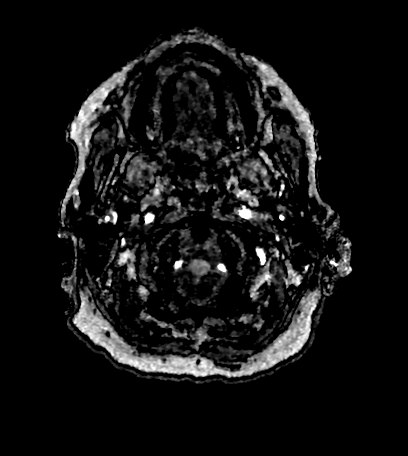
[im 23/217]
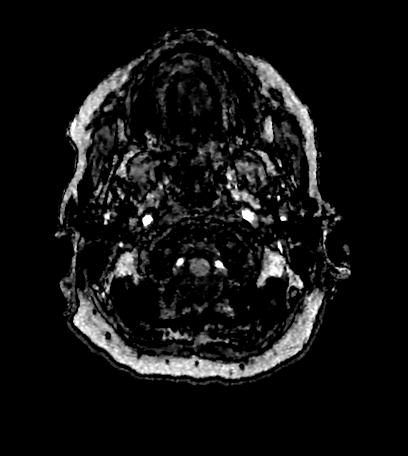
[im 28/217]
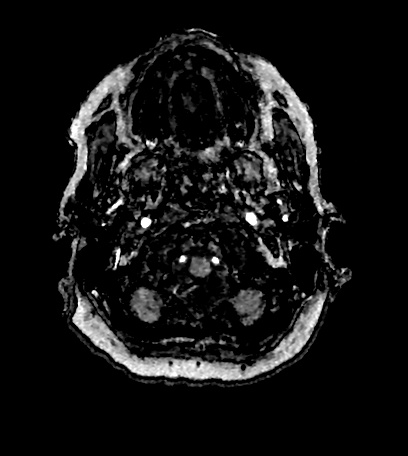
[im 33/217]
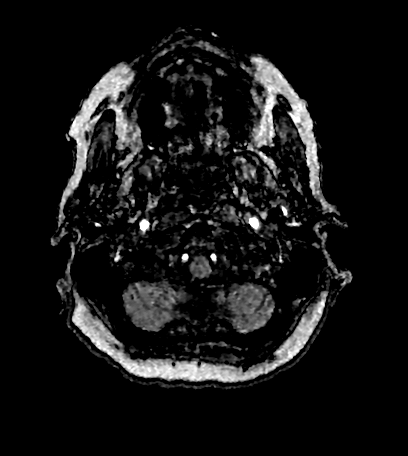
[im 37/217]
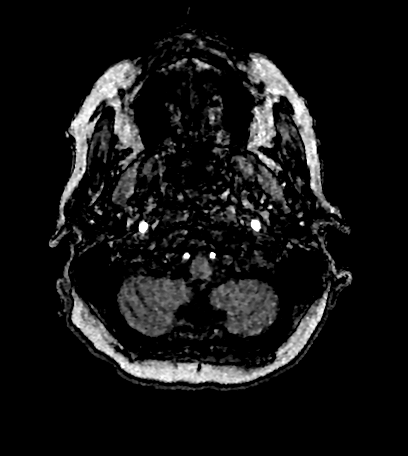
[im 42/217]
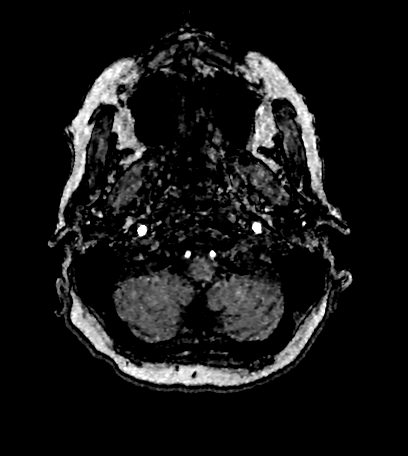
[im 46/217]
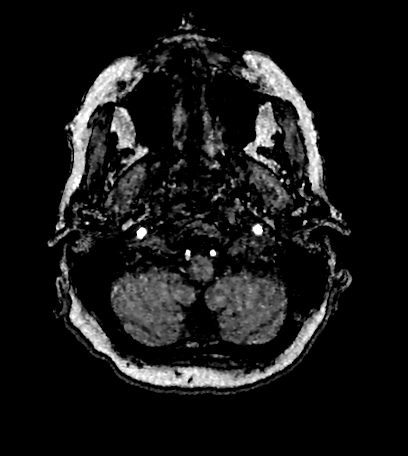
[im 51/217]
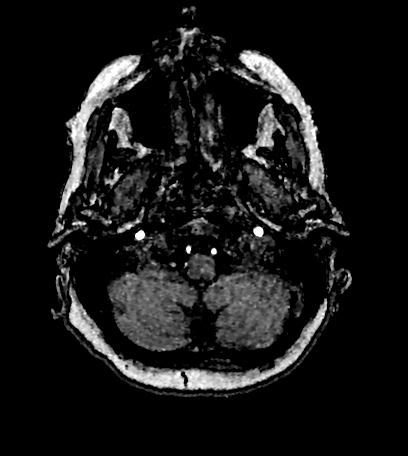
[im 56/217]
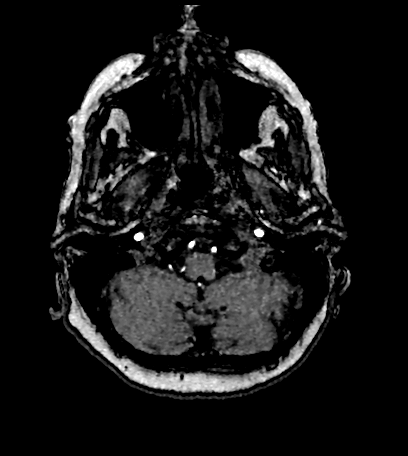
[im 60/217]
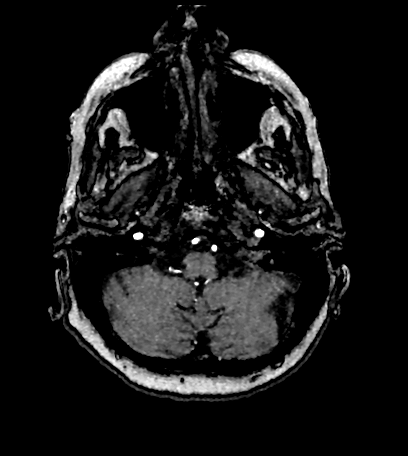
[im 65/217]
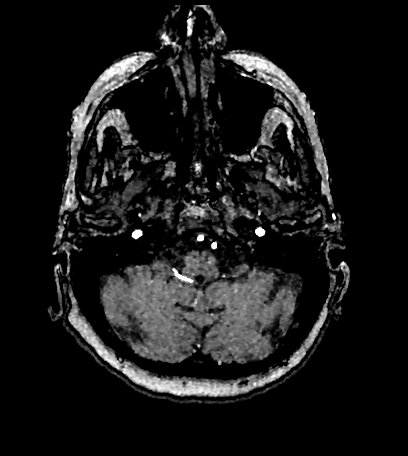
[im 69/217]
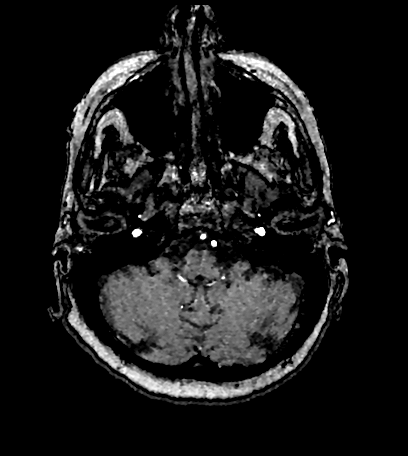
[im 74/217]
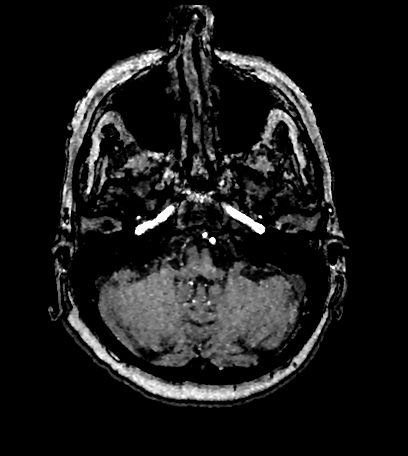
[im 79/217]
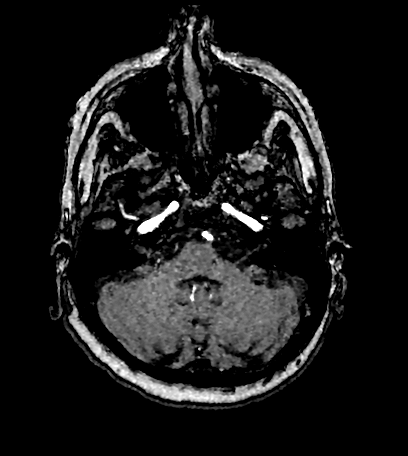
[im 83/217]
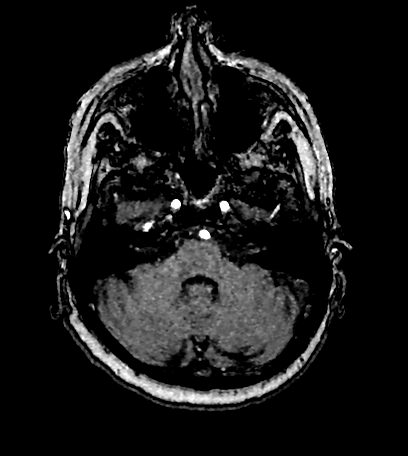
[im 88/217]
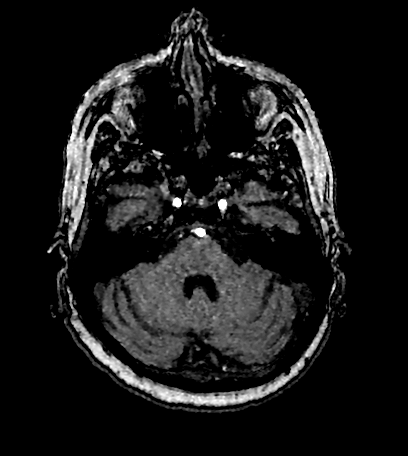
[im 97/217]
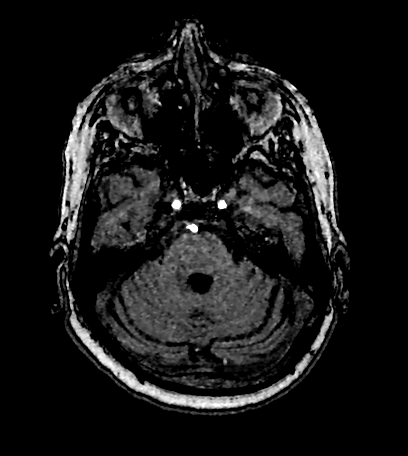
[im 111/217]
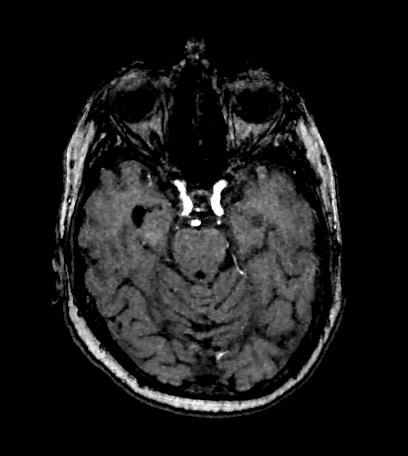
[im 125/217]
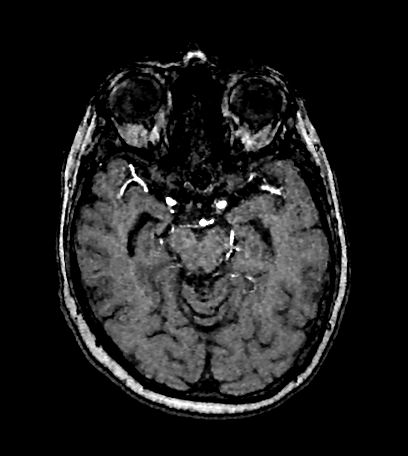
[im 152/217]
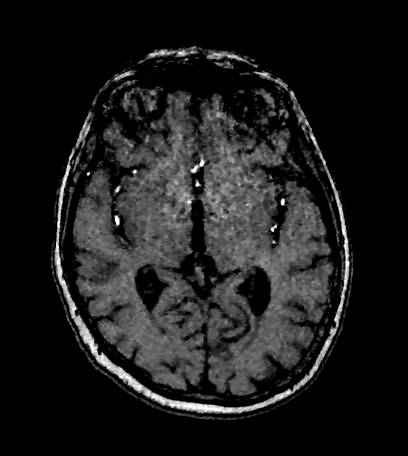
[im 180/217]
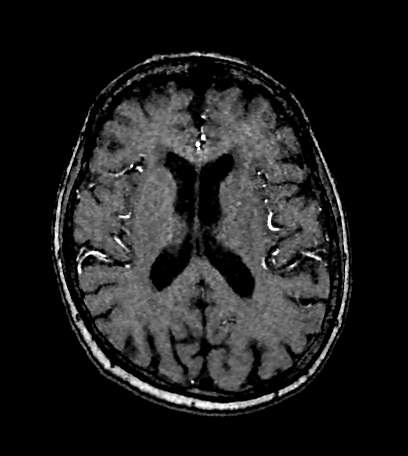
[im 184/217]
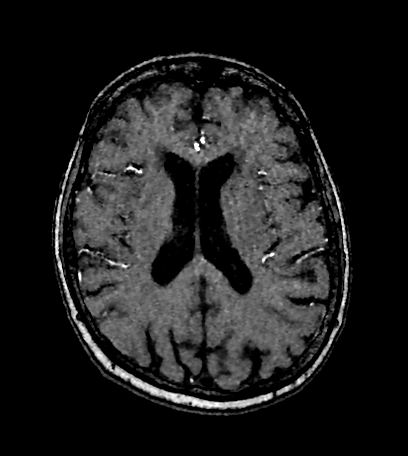
[im 207/217]
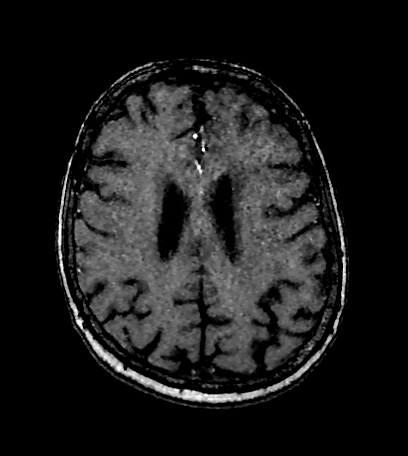

[27 of 48 positions shown; findings below may reference images not displayed]

FINDINGS: MRA NECK FINDINGS

Visualized arch is unremarkable. Great vessel origins are patent.
Common, internal, and external carotid arteries are patent.
Extracranial vertebral arteries are patent. Vertebral origins are
not well evaluated. The vessels are codominant and without stenosis.

MRA HEAD FINDINGS

Intracranial internal carotid arteries are patent with mild
atherosclerotic irregularity. Anterior cerebral arteries are patent.
Anterior communicating artery is present. Middle cerebral arteries
are patent. Intracranial vertebral arteries, basilar artery, and
posterior cerebral arteries are patent. No significant stenosis or
aneurysm identified.
IMPRESSION: No large vessel occlusion, hemodynamically significant stenosis, or
evidence of dissection.

## 2021-09-29 IMAGING — CR DG CHEST 2V
2 series · 2 of 2 positions shown · non-contrast
Comparison: None Available.

CLINICAL DATA: Chest pain

EXAM:
CHEST - 2 VIEW

[chest pa]
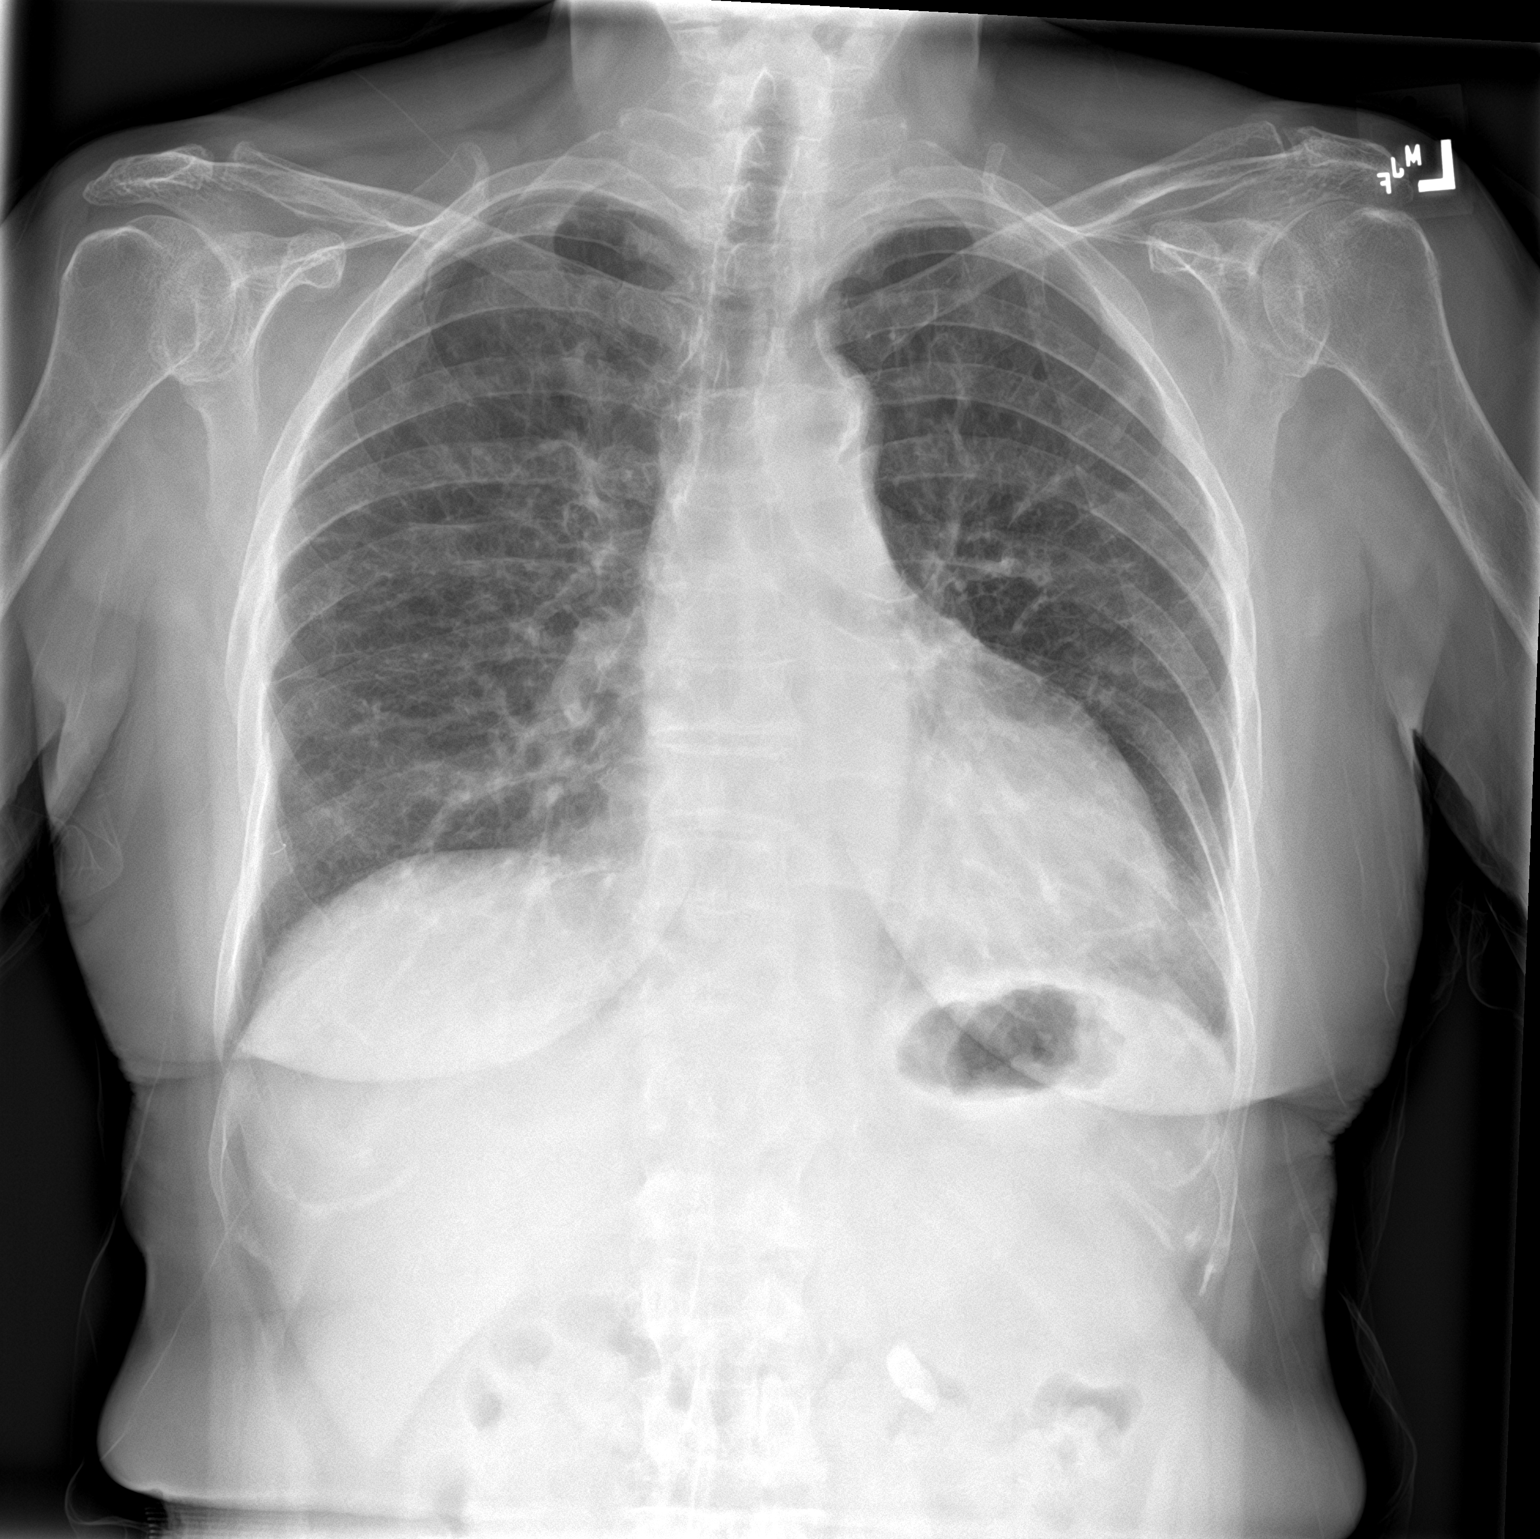

[chest lat]
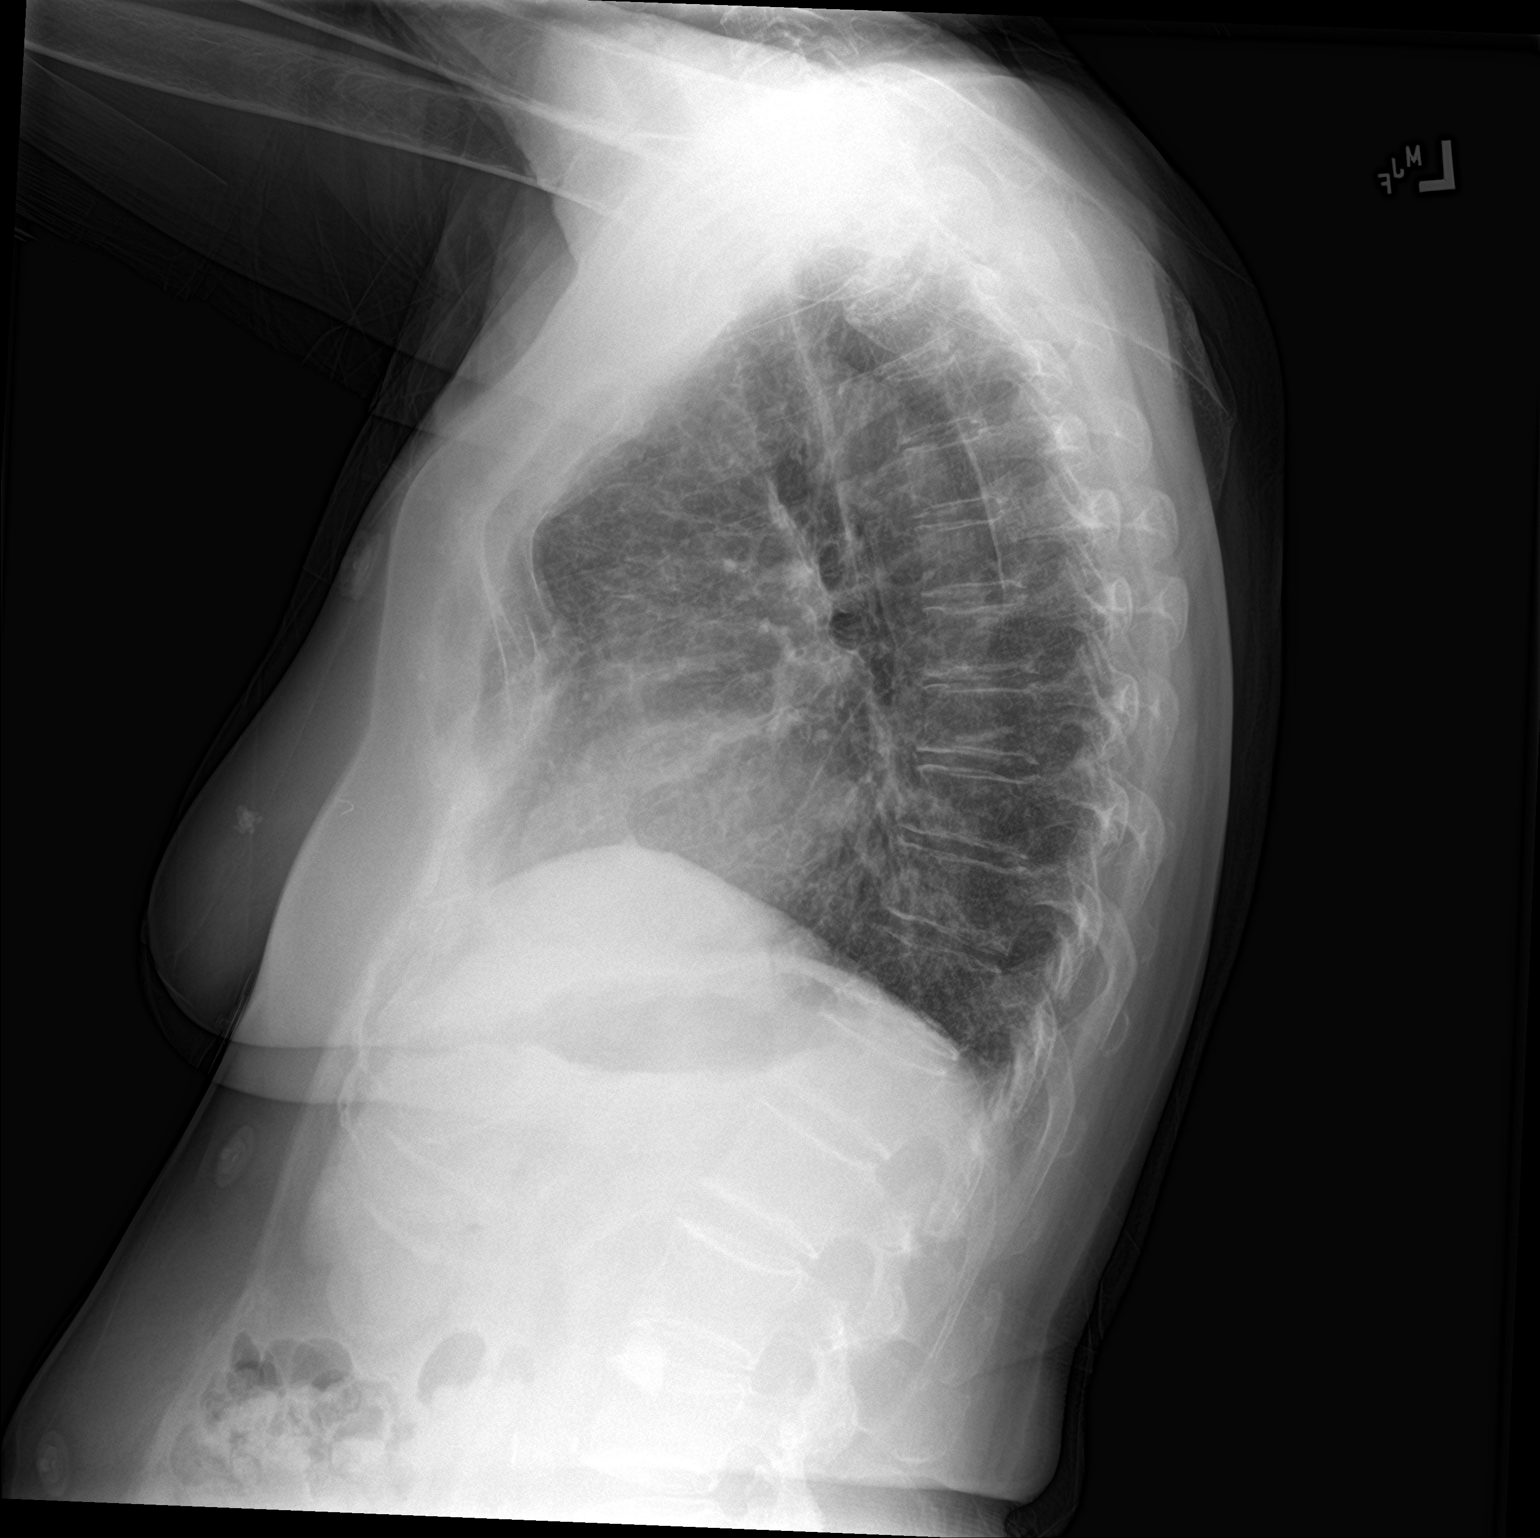

[2 of 2 positions shown; findings below may reference images not displayed]

FINDINGS: Heart size and pulmonary vascularity are normal. Peribronchial
thickening and interstitial changes suggesting chronic bronchitis
and fibrosis. No airspace disease or consolidation. No pleural
effusions. No pneumothorax. Pectus excavatum. Calcification in the
left upper quadrant possibly representing a renal stone.
IMPRESSION: Bronchitic changes and fibrosis in the lungs. No focal
consolidation. Calcification in the left upper quadrant may
represent a renal stone.

## 2021-09-29 MED ORDER — OXYCODONE-ACETAMINOPHEN 5-325 MG PO TABS: 1.0000 | ORAL_TABLET | Freq: Four times a day (QID) | ORAL | 0 refills | Status: DC | PRN

## 2021-09-29 MED ORDER — IOHEXOL 350 MG/ML SOLN
75.0000 mL | Freq: Once | INTRAVENOUS | Status: AC | PRN
  Administered 2021-09-29: 75 mL via INTRAVENOUS

## 2021-09-29 MED ORDER — KETOROLAC TROMETHAMINE 15 MG/ML IJ SOLN
15.0000 mg | Freq: Once | INTRAMUSCULAR | Status: AC
Start: 2021-09-29 — End: 2021-09-29
  Administered 2021-09-29: 15 mg via INTRAVENOUS
  Filled 2021-09-29: qty 1

## 2021-09-29 MED ORDER — GADOBUTROL 1 MMOL/ML IV SOLN
6.0000 mL | Freq: Once | INTRAVENOUS | Status: AC | PRN
Start: 2021-09-29 — End: 2021-09-29
  Administered 2021-09-29: 6 mL via INTRAVENOUS

## 2021-09-29 NOTE — ED Notes (Signed)
Pt back from MRI, pt reports increased pain from moving for her MRI, pt states that she doesn't want anything for pain at this time,  pt talking in full sentences, pt requests some water.

## 2021-09-29 NOTE — ED Notes (Signed)
Pt in bed, pt states that she is ready to go home, pt reports 1/10 pain, pt verbalized d/c instructions and follow up. Pt from dpt

## 2021-09-29 NOTE — ED Notes (Signed)
Pt sitting up in bed, pt c/o neck pain that is making her sob, states that it hurts more to move, pt states that sitting up is comfortable.  Pt has no requests at this time, pt awaits ct result. Pt talking in full sentences, pt sinus rhythm

## 2021-09-29 NOTE — Discharge Instructions (Addendum)
There was a lung nodule in your right middle lobe of your lung.  Radiology thinks that should be followed up in here with another CAT scan just to make sure its not changing.  That should not be causing her pain.  Everything else looks okay.  Please follow-up with your primary care doctor.  Please do not hesitate to return here if you have any other problems or the pain returns.  You can take Tylenol for the pain or you can take Percocet 1 or 2 pills 4 times a day.  Be very careful.  The Percocet can make you woozy.  It can make you constipated.  Do not drive if you are taking it.  With Percocet has Tylenol in it so do not take extra Tylenol with the Percocet you can get too much Tylenol.

## 2021-09-29 NOTE — ED Triage Notes (Signed)
Patient ambulatory to triage with steady gait, without difficulty or distress noted; pt reports tonight having pain "from my head all the way down to my chest when I breathe"; denies any other accomp symptoms

## 2021-09-29 NOTE — ED Notes (Signed)
Pt yelling from room, states that she wants the monitor to "shut up", pt pulled all leads from chest and states that she refuses to wear them any longer.

## 2021-09-29 NOTE — ED Notes (Signed)
Pt in bed, pt reports decreased pain, pt ambulatory to the bathroom .

## 2021-09-29 NOTE — ED Provider Notes (Signed)
[facility] Provider Note    Event Date/Time   First MD Initiated Contact with Patient 09/29/21 [id number]     (approximate)   History   Chest Pain   HPI  Amber Pollard is a 81 y.o. female who reports she was sitting on the couch watching TV at 10:00 last night when she had sudden onset of severe headache from the back of her head down into the neck and into the upper chest.  It radiates down the back of the neck and into the upper chest and front and back.  It is worse with deep breathing and worse with arm.  Putting her arms over her head in the CT scan and made it quite bad.  She had not done anything all day long to strain herself or have any other problems she was just sitting on the couch when it started.  She has no nausea vomiting fever chills numbness weakness incontinence ataxia she is not dropping anything there is no visual changes.      Physical Exam   Triage Vital Signs: ED Triage Vitals  Enc Vitals Group     BP 09/29/21 0204 (!) 182/100     Pulse Rate 09/29/21 0204 96     Resp 09/29/21 0204 18     Temp 09/29/21 0204 97.8 F (36.6 C)     Temp Source 09/29/21 0204 Oral     SpO2 09/29/21 0204 97 %     Weight 09/29/21 0203 134 lb (60.8 kg)     Height 09/29/21 0203 5\' 1"  (1.549 m)     Head Circumference --      Peak Flow --      Pain Score 09/29/21 0203 8     Pain Loc --      Pain Edu? --      Excl. in GC? --     Most recent vital signs: Vitals:   09/29/21 1205 09/29/21 1302  BP: (!) 149/77 128/67  Pulse: 96 91  Resp: 18 18  Temp: 98.9 F (37.2 C) 98.5 F (36.9 C)  SpO2: 93% 95%     General: Awake, no distress.  Eyes pupils equal round reactive extraocular movements intact Neck is supple and nontender CV:  Good peripheral perfusion.  Heart regular rate and rhythm no audible murmurs Resp:  Normal effort.  Lungs are clear Abd:  No distention.  Abdomen is soft and nontender Other:  Patient with no numbness or weakness in her arms  or legs no ataxia   ED Results / Procedures / Treatments   Labs (all labs ordered are listed, but only abnormal results are displayed) Labs Reviewed  CBC WITH DIFFERENTIAL/PLATELET - Abnormal; Notable for the following components:      Result Value   WBC 11.4 (*)    Neutro Abs 9.3 (*)    All other components within normal limits  COMPREHENSIVE METABOLIC PANEL - Abnormal; Notable for the following components:   Glucose, Bld 133 (*)    All other components within normal limits  TROPONIN I (HIGH SENSITIVITY)  TROPONIN I (HIGH SENSITIVITY)     EKG  EKG read interpreted by me shows normal sinus at 91 left axis left bundle branch no old EKGs to compare to EKG #2 done 3 hours later also shows normal sinus rhythm rate of 90 left axis left bundle branch compared to EKG #1 there is some loss of amplitude and some of the leads especially in lead I but no other  changes RADIOLOGY Chest x-ray read by radiology reviewed and interpreted again by me shows no acute disease Radiology reads bronchitic and fibrotic changes I cannot disagree.  There is a calcification in the left upper quadrant of the abdomen but the radiologist thinks may be a kidney stone there is no pain or tenderness or masses in that area. CT angio of the lungs read by radiology reviewed and interpreted By me shows no acute disease.  Radiologist notes a nodule in the right middle lobe and needs follow-up in a year. PROCEDURES:  Critical Care performed:   Procedures   MEDICATIONS ORDERED IN ED: Medications  iohexol (OMNIPAQUE) 350 MG/ML injection 75 mL (75 mLs Intravenous Contrast Given 09/29/21 0557)  gadobutrol (GADAVIST) 1 MMOL/ML injection 6 mL (6 mLs Intravenous Contrast Given 09/29/21 1057)  ketorolac (TORADOL) 15 MG/ML injection 15 mg (15 mg Intravenous Given 09/29/21 1203)     IMPRESSION / MDM / ASSESSMENT AND PLAN / ED COURSE  I reviewed the triage vital signs and the nursing notes. Lab work EKG troponins CT and MRI  do not show any cause for the patient's pain.  I am not quite sure what it was.  The patient did improve markedly after 1 dose of Toradol.  I will not give her any more Toradol.  I do not want her renal function to be affected.  I will let her go and give her some Percocet if she needs it.  She will return if she has any further problems.  I really do not have any idea what was causing her pain and the sudden onset of it.    Patient's presentation is most consistent with acute complicated illness / injury requiring diagnostic workup. The patient is on the cardiac monitor to evaluate for evidence of arrhythmia and/or significant heart rate changes none were seen   FINAL CLINICAL IMPRESSION(S) / ED DIAGNOSES   Final diagnoses:  Nonintractable headache, unspecified chronicity pattern, unspecified headache type  Neck pain  Nonspecific chest pain     Rx / DC Orders   ED Discharge Orders          Ordered    oxyCODONE-acetaminophen (PERCOCET) 5-325 MG tablet  Every 6 hours PRN        09/29/21 1253             Note:  This document was prepared using Dragon voice recognition software and may include unintentional dictation errors.   Arnaldo Natal, MD 09/29/21 1504

## 2021-10-08 NOTE — Progress Notes (Signed)
Patient had a severe headache sudden onset radiating from the back of her head down into the neck and upper chest.  This is consistent with the presentation of a dissection which is why I did the MRA.

## 2022-09-15 ENCOUNTER — Encounter: Payer: Self-pay | Admitting: Ophthalmology

## 2022-09-15 NOTE — Anesthesia Preprocedure Evaluation (Addendum)
Anesthesia Evaluation  Patient identified by MRN, date of birth, ID band Patient awake    Reviewed: Allergy & Precautions, H&P , NPO status , Patient's Chart, lab work & pertinent test results  Airway Mallampati: II  TM Distance: >3 FB Neck ROM: Full    Dental  (+) Upper Dentures, Edentulous Lower   Pulmonary former smoker   Pulmonary exam normal breath sounds clear to auscultation       Cardiovascular hypertension, Normal cardiovascular exam Rhythm:Regular Rate:Normal     Neuro/Psych negative neurological ROS  negative psych ROS   GI/Hepatic negative GI ROS, Neg liver ROS,,,  Endo/Other  negative endocrine ROS Hyperthyroidism   Renal/GU Renal diseasenegative Renal ROS  negative genitourinary   Musculoskeletal negative musculoskeletal ROS (+)    Abdominal   Peds negative pediatric ROS (+)  Hematology negative hematology ROS (+)   Anesthesia Other Findings Environmental allergies  Hernia of abdominal wall Hypertension  Hyperthyroidism CKD (chronic kidney disease), stage III  Wears dentures    Reproductive/Obstetrics negative OB ROS                             Anesthesia Physical Anesthesia Plan  ASA: 3  Anesthesia Plan: MAC   Post-op Pain Management:    Induction: Intravenous  PONV Risk Score and Plan:   Airway Management Planned: Natural Airway and Nasal Cannula  Additional Equipment:   Intra-op Plan:   Post-operative Plan:   Informed Consent: I have reviewed the patients History and Physical, chart, labs and discussed the procedure including the risks, benefits and alternatives for the proposed anesthesia with the patient or authorized representative who has indicated his/her understanding and acceptance.     Dental Advisory Given  Plan Discussed with: Anesthesiologist, CRNA and Surgeon  Anesthesia Plan Comments: (Patient consented for risks of anesthesia  including but not limited to:  - adverse reactions to medications - damage to eyes, teeth, lips or other oral mucosa - nerve damage due to positioning  - sore throat or hoarseness - Damage to heart, brain, nerves, lungs, other parts of body or loss of life  Patient voiced understanding.)        Anesthesia Quick Evaluation

## 2022-09-16 NOTE — Discharge Instructions (Signed)

## 2022-09-20 ENCOUNTER — Encounter
Admission: RE | Disposition: A | Discharge status: Home / Self Care | Payer: Self-pay | Source: Home / Self Care | Attending: Ophthalmology

## 2022-09-20 ENCOUNTER — Ambulatory Visit
Admission: RE | Admit: 2022-09-20 | Discharge: 2022-09-20 | Disposition: A | Discharge status: Home / Self Care | Payer: Medicare Other | Attending: Ophthalmology | Admitting: Ophthalmology

## 2022-09-20 ENCOUNTER — Ambulatory Visit: Payer: Medicare Other | Admitting: Anesthesiology

## 2022-09-20 ENCOUNTER — Encounter: Payer: Self-pay | Admitting: Ophthalmology

## 2022-09-20 ENCOUNTER — Other Ambulatory Visit: Payer: Self-pay

## 2022-09-20 DIAGNOSIS — N183 Chronic kidney disease, stage 3 unspecified: Secondary | ICD-10-CM | POA: Insufficient documentation

## 2022-09-20 DIAGNOSIS — E0590 Thyrotoxicosis, unspecified without thyrotoxic crisis or storm: Secondary | ICD-10-CM | POA: Insufficient documentation

## 2022-09-20 DIAGNOSIS — Z87891 Personal history of nicotine dependence: Secondary | ICD-10-CM | POA: Diagnosis not present

## 2022-09-20 DIAGNOSIS — H2511 Age-related nuclear cataract, right eye: Secondary | ICD-10-CM | POA: Diagnosis present

## 2022-09-20 DIAGNOSIS — I129 Hypertensive chronic kidney disease with stage 1 through stage 4 chronic kidney disease, or unspecified chronic kidney disease: Secondary | ICD-10-CM | POA: Insufficient documentation

## 2022-09-20 HISTORY — DX: Essential (primary) hypertension: I10

## 2022-09-20 HISTORY — DX: Presence of dental prosthetic device (complete) (partial): Z97.2

## 2022-09-20 HISTORY — PX: CATARACT EXTRACTION W/PHACO: SHX586

## 2022-09-20 HISTORY — DX: Chronic kidney disease, stage 3 unspecified: N18.30

## 2022-09-20 HISTORY — DX: Thyrotoxicosis, unspecified without thyrotoxic crisis or storm: E05.90

## 2022-09-20 SURGERY — PHACOEMULSIFICATION, CATARACT, WITH IOL INSERTION
Anesthesia: Monitor Anesthesia Care | Site: Eye | Laterality: Right

## 2022-09-20 MED ORDER — SIGHTPATH DOSE#1 BSS IO SOLN
INTRAOCULAR | Status: DC | PRN
  Administered 2022-09-20: 15 mL

## 2022-09-20 MED ORDER — ARMC OPHTHALMIC DILATING DROPS
1.0000 | OPHTHALMIC | Status: DC | PRN
  Administered 2022-09-20 (×3): 1 via OPHTHALMIC

## 2022-09-20 MED ORDER — PHENYLEPHRINE-KETOROLAC 1-0.3 % IO SOLN
INTRAOCULAR | Status: DC | PRN
  Administered 2022-09-20: 123 mL via OPHTHALMIC

## 2022-09-20 MED ORDER — LACTATED RINGERS IV SOLN: INTRAVENOUS | Status: DC

## 2022-09-20 MED ORDER — MIDAZOLAM HCL 2 MG/2ML IJ SOLN
INTRAMUSCULAR | Status: DC | PRN
  Administered 2022-09-20: 1 mg via INTRAVENOUS

## 2022-09-20 MED ORDER — SIGHTPATH DOSE#1 NA HYALUR & NA CHOND-NA HYALUR IO KIT
PACK | INTRAOCULAR | Status: DC | PRN
  Administered 2022-09-20: 1 via OPHTHALMIC

## 2022-09-20 MED ORDER — FENTANYL CITRATE (PF) 100 MCG/2ML IJ SOLN
INTRAMUSCULAR | Status: DC | PRN
  Administered 2022-09-20: 50 ug via INTRAVENOUS

## 2022-09-20 MED ORDER — BRIMONIDINE TARTRATE-TIMOLOL 0.2-0.5 % OP SOLN
OPHTHALMIC | Status: DC | PRN
  Administered 2022-09-20: 1 [drp] via OPHTHALMIC

## 2022-09-20 MED ORDER — LIDOCAINE HCL (PF) 2 % IJ SOLN
INTRAOCULAR | Status: DC | PRN
  Administered 2022-09-20: 1 mL via INTRAOCULAR

## 2022-09-20 MED ORDER — TETRACAINE HCL 0.5 % OP SOLN
1.0000 [drp] | OPHTHALMIC | Status: DC | PRN
  Administered 2022-09-20 (×3): 1 [drp] via OPHTHALMIC

## 2022-09-20 MED ORDER — MOXIFLOXACIN HCL 0.5 % OP SOLN
OPHTHALMIC | Status: DC | PRN
  Administered 2022-09-20: .2 mL via OPHTHALMIC

## 2022-09-20 SURGICAL SUPPLY — 10 items
CATARACT SUITE SIGHTPATH (MISCELLANEOUS) ×1 IMPLANT
DISSECTOR HYDRO NUCLEUS 50X22 (MISCELLANEOUS) ×1 IMPLANT
DRSG TEGADERM 2-3/8X2-3/4 SM (GAUZE/BANDAGES/DRESSINGS) ×1 IMPLANT
FEE CATARACT SUITE SIGHTPATH (MISCELLANEOUS) ×1 IMPLANT
GLOVE SURG SYN 7.5 E (GLOVE) ×1 IMPLANT
GLOVE SURG SYN 7.5 PF PI (GLOVE) ×1 IMPLANT
GLOVE SURG SYN 8.5 E (GLOVE) ×1 IMPLANT
GLOVE SURG SYN 8.5 PF PI (GLOVE) ×1 IMPLANT
LENS CLAREON TORIC 18.0 ×1 IMPLANT
LENS IOL CLRN TRC 4 18.0 IMPLANT

## 2022-09-20 NOTE — Transfer of Care (Signed)
Immediate Anesthesia Transfer of Care Note  Patient: Amber Pollard  Procedure(s) Performed: CATARACT EXTRACTION PHACO AND INTRAOCULAR LENS PLACEMENT (IOC) RIGHT OMIDRIA  CLAREON TORIC LENS  12.25  01.08.6 (Right: Eye)  Patient Location: PACU  Anesthesia Type:MAC  Level of Consciousness: awake, alert , and oriented  Airway & Oxygen Therapy: Patient Spontanous Breathing  Post-op Assessment: Report given to RN  Post vital signs: Reviewed  Last Vitals: BP elevation discussed with Dr. Juel Burrow  .  Temp normal  Vitals Value Taken Time  BP 164/112 09/20/22 1131  Temp    Pulse 72 09/20/22 1132  Resp 19 09/20/22 1132  SpO2 96 % 09/20/22 1132  Vitals shown include unvalidated device data.  Last Pain:  Vitals:   09/20/22 1001  TempSrc: Temporal  PainSc: 0-No pain         Complications: No notable events documented.

## 2022-09-20 NOTE — Anesthesia Postprocedure Evaluation (Signed)
Anesthesia Post Note  Patient: Amber Pollard  Procedure(s) Performed: CATARACT EXTRACTION PHACO AND INTRAOCULAR LENS PLACEMENT (IOC) RIGHT OMIDRIA  CLAREON TORIC LENS  12.25  01.08.6 (Right: Eye)  Patient location during evaluation: PACU Anesthesia Type: MAC Level of consciousness: awake and alert Pain management: pain level controlled Vital Signs Assessment: post-procedure vital signs reviewed and stable Respiratory status: spontaneous breathing, nonlabored ventilation, respiratory function stable and patient connected to nasal cannula oxygen Cardiovascular status: stable and blood pressure returned to baseline Postop Assessment: no apparent nausea or vomiting Anesthetic complications: no   No notable events documented.   Last Vitals:  Vitals:   09/20/22 1134 09/20/22 1139  BP: (!) 164/112 (!) 142/87  Pulse: 72 66  Resp: 12 15  Temp: (!) 36.4 C (!) 36.4 C  SpO2: 97% 96%    Last Pain:  Vitals:   09/20/22 1139  TempSrc:   PainSc: 0-No pain                 Malaak Stach C Lamarco Gudiel

## 2022-09-20 NOTE — H&P (Signed)
[facility]   Primary Care Physician:  [facility] Ophthalmologist: Dr. Deberah Pelton  Pre-Procedure History & Physical: HPI:  Amber Pollard is a 82 y.o. female here for cataract surgery.   Past Medical History:  Diagnosis Date   CKD (chronic kidney disease), stage III (HCC)    Environmental allergies    Hernia of abdominal wall    Hypertension    Hyperthyroidism    Wears dentures    full upper and lower    Past Surgical History:  Procedure Laterality Date   ABDOMINAL HYSTERECTOMY     BLADDER REPAIR     CESAREAN SECTION     large bowel surgery      Prior to Admission medications   Medication Sig Start Date End Date Taking? Authorizing Provider  amLODipine (NORVASC) 2.5 MG tablet Take 2.5 mg by mouth daily.   Yes [provider]  cholecalciferol (VITAMIN D3) 25 MCG (1000 UNIT) tablet Take 1,000 Units by mouth daily.   Yes [provider]  cyanocobalamin 100 MCG tablet Take 1 tablet by mouth daily.   Yes [provider]  Docusate Calcium (STOOL SOFTENER PO) Take by mouth daily.   Yes [provider]  methimazole (TAPAZOLE) 5 MG tablet Take 2.5 mg by mouth daily. 10/23/20  Yes [provider]  montelukast (SINGULAIR) 10 MG tablet Take 10 mg by mouth daily. 10/23/20  Yes [provider]  triamcinolone cream (KENALOG) 0.1 % Apply 1 application topically 2 (two) times daily. Patient not taking: Reported on 09/15/2022 12/26/20   Eusebio Friendly B, PA-C    Allergies as of 08/05/2022 - Review Complete 09/29/2021  Allergen Reaction Noted   Fish allergy Rash 12/09/2014    History reviewed. No pertinent family history.  Social History   Socioeconomic History   Marital status: Single    Spouse name: Not on file   Number of children: Not on file   Years of education: Not on file   Highest education level: Not on file  Occupational History   Not on file  Tobacco Use   Smoking status: Former    Types: Cigarettes     Quit date: 48    Years since quitting: 32.4   Smokeless tobacco: Never   Tobacco comments:    Smoked for approx 30 yrs  Vaping Use   Vaping Use: Never used  Substance and Sexual Activity   Alcohol use: No   Drug use: Never   Sexual activity: Not on file  Other Topics Concern   Not on file  Social History Narrative   Not on file   Social Determinants of Health   Financial Resource Strain: Not on file  Food Insecurity: Not on file  Transportation Needs: Not on file  Physical Activity: Not on file  Stress: Not on file  Social Connections: Not on file  Intimate Partner Violence: Not on file    Review of Systems: See HPI, otherwise negative ROS  Physical Exam: Ht 5\' 1"  (1.549 m)   Wt 54.4 kg   BMI 22.67 kg/m  General:   Alert, cooperative in NAD Head:  Normocephalic and atraumatic. Respiratory:  Normal work of breathing. Cardiovascular:  RRR  Impression/Plan: Amber Pollard is here for cataract surgery.  Risks, benefits, limitations, and alternatives regarding cataract surgery have been reviewed with the patient.  Questions have been answered.  All parties agreeable.   Estanislado Pandy, MD  09/20/2022, 7:15 AM

## 2022-09-20 NOTE — Op Note (Signed)
OPERATIVE NOTE  Amber Pollard [id number] 09/20/2022   PREOPERATIVE DIAGNOSIS: Nuclear sclerotic cataract right eye. H25.11   POSTOPERATIVE DIAGNOSIS: Nuclear sclerotic cataract right eye. H25.11   PROCEDURE:  Phacoemusification with Toric posterior chamber intraocular lens placement of the right eye  Ultrasound time: Procedure(s): CATARACT EXTRACTION PHACO AND INTRAOCULAR LENS PLACEMENT (IOC) RIGHT OMIDRIA  CLAREON TORIC LENS  12.25  01.08.6 (Right)  LENS:   Implant Name Type Inv. Item Serial No. Manufacturer Lot No. LRB No. Used Action  LENS CLAREON TORIC 18.0 - [id number]  LENS CLAREON TORIC 18.0 [id number] [employer]  Right 1 Implanted    [id number] Toric intraocular lens with 2.25 diopters of cylindrical power with axis orientation at 145 degrees.  SURGEON:  Julious Payer. Rolley Sims, MD   ANESTHESIA:  Topical with tetracaine drops, augmented with 1% preservative-free intracameral lidocaine.   COMPLICATIONS:  None.   DESCRIPTION OF PROCEDURE:  The patient was identified in the holding room and transported to the operating room and placed in the supine position under the operating microscope.  The right eye was identified as the operative eye, which was prepped and draped in the usual sterile ophthalmic fashion.   A 1 millimeter clear-corneal paracentesis was made superotemporally. Preservative-free 1% lidocaine mixed with 1:1,000 bisulfite-free aqueous solution of epinephrine was injected into the anterior chamber. The anterior chamber was then filled with Viscoat viscoelastic. A 2.4 millimeter keratome was used to make a clear-corneal incision inferotemporally. A curvilinear capsulorrhexis was made with a cystotome and capsulorrhexis forceps. Balanced salt solution was used to hydrodissect and hydrodelineate the nucleus. Phacoemulsification was then used to remove the lens nucleus and epinucleus. The remaining cortex was then removed using the irrigation and aspiration handpiece. Provisc was then  placed into the capsular bag to distend it for lens placement. The Verion digital marker was used to align the implant at the intended axis.  A +18.00 D [id number] Toric lens was then injected into the capsular bag.  It was rotated clockwise until the axis marks on the lens were approximately 15 degrees in the counterclockwise direction to the intended alignment.  The viscoelastic was aspirated from the eye using the irrigation aspiration handpiece.  Then, a blunt chopper through the sideport incision was used to rotate the lens in a clockwise direction until the axis markings of the intraocular lens were lined up with the Verion alignment.  Balanced salt solution was then used to hydrate the wounds.   The anterior chamber was inflated to a physiologic pressure with balanced salt solution.  No wound leaks were noted. Vigamox was injected intracamerally.  Timolol and Brimonidine drops were applied to the eye.  The patient was taken to the recovery room in stable condition without complications of anesthesia or surgery.  [name] 09/20/2022, 11:30 AM

## 2022-09-21 ENCOUNTER — Encounter: Payer: Self-pay | Admitting: Ophthalmology

## 2022-10-25 NOTE — Anesthesia Preprocedure Evaluation (Addendum)
Anesthesia Evaluation  Patient identified by MRN, date of birth, ID band Patient awake    Reviewed: Allergy & Precautions, H&P , NPO status , Patient's Chart, lab work & pertinent test results  Airway Mallampati: II  TM Distance: >3 FB Neck ROM: Full    Dental no notable dental hx. (+) Upper Dentures, Edentulous Lower   Pulmonary neg pulmonary ROS, former smoker   Pulmonary exam normal breath sounds clear to auscultation       Cardiovascular hypertension, negative cardio ROS Normal cardiovascular exam Rhythm:Regular Rate:Normal     Neuro/Psych negative neurological ROS  negative psych ROS   GI/Hepatic negative GI ROS, Neg liver ROS,,,  Endo/Other  negative endocrine ROS Hyperthyroidism   Renal/GU Renal diseasenegative Renal ROS  negative genitourinary   Musculoskeletal negative musculoskeletal ROS (+)    Abdominal   Peds negative pediatric ROS (+)  Hematology negative hematology ROS (+)   Anesthesia Other Findings Environmental allergies  Hernia of abdominal wall Hypertension Hyperthyroidism CKD (chronic kidney disease), stage III  Wears dentures  Previous cataract surgery 09-20-22   Reproductive/Obstetrics negative OB ROS                             Anesthesia Physical Anesthesia Plan  ASA: 3  Anesthesia Plan: MAC   Post-op Pain Management:    Induction: Intravenous  PONV Risk Score and Plan:   Airway Management Planned: Natural Airway and Nasal Cannula  Additional Equipment:   Intra-op Plan:   Post-operative Plan:   Informed Consent: I have reviewed the patients History and Physical, chart, labs and discussed the procedure including the risks, benefits and alternatives for the proposed anesthesia with the patient or authorized representative who has indicated his/her understanding and acceptance.     Dental Advisory Given  Plan Discussed with: Anesthesiologist,  CRNA and Surgeon  Anesthesia Plan Comments: (Patient consented for risks of anesthesia including but not limited to:  - adverse reactions to medications - damage to eyes, teeth, lips or other oral mucosa - nerve damage due to positioning  - sore throat or hoarseness - Damage to heart, brain, nerves, lungs, other parts of body or loss of life  Patient voiced understanding.)        Anesthesia Quick Evaluation

## 2022-10-26 NOTE — Discharge Instructions (Signed)

## 2022-10-28 ENCOUNTER — Ambulatory Visit
Admission: RE | Admit: 2022-10-28 | Discharge: 2022-10-28 | Disposition: A | Discharge status: Home / Self Care | Payer: Medicare Other | Attending: Ophthalmology | Admitting: Ophthalmology

## 2022-10-28 ENCOUNTER — Ambulatory Visit: Payer: Self-pay | Admitting: Anesthesiology

## 2022-10-28 ENCOUNTER — Encounter
Admission: RE | Disposition: A | Discharge status: Home / Self Care | Payer: Self-pay | Source: Home / Self Care | Attending: Ophthalmology

## 2022-10-28 ENCOUNTER — Other Ambulatory Visit: Payer: Self-pay

## 2022-10-28 ENCOUNTER — Encounter: Payer: Self-pay | Admitting: Ophthalmology

## 2022-10-28 DIAGNOSIS — Z87891 Personal history of nicotine dependence: Secondary | ICD-10-CM | POA: Diagnosis not present

## 2022-10-28 DIAGNOSIS — I129 Hypertensive chronic kidney disease with stage 1 through stage 4 chronic kidney disease, or unspecified chronic kidney disease: Secondary | ICD-10-CM | POA: Insufficient documentation

## 2022-10-28 DIAGNOSIS — H2512 Age-related nuclear cataract, left eye: Secondary | ICD-10-CM | POA: Insufficient documentation

## 2022-10-28 DIAGNOSIS — N183 Chronic kidney disease, stage 3 unspecified: Secondary | ICD-10-CM | POA: Insufficient documentation

## 2022-10-28 DIAGNOSIS — E0590 Thyrotoxicosis, unspecified without thyrotoxic crisis or storm: Secondary | ICD-10-CM | POA: Insufficient documentation

## 2022-10-28 HISTORY — PX: CATARACT EXTRACTION W/PHACO: SHX586

## 2022-10-28 SURGERY — PHACOEMULSIFICATION, CATARACT, WITH IOL INSERTION
Anesthesia: Monitor Anesthesia Care | Laterality: Left

## 2022-10-28 MED ORDER — SIGHTPATH DOSE#1 BSS IO SOLN: INTRAOCULAR | Status: DC | PRN

## 2022-10-28 MED ORDER — ARMC OPHTHALMIC DILATING DROPS
1.0000 | OPHTHALMIC | Status: DC | PRN
  Administered 2022-10-28 (×3): 1 via OPHTHALMIC

## 2022-10-28 MED ORDER — SIGHTPATH DOSE#1 BSS IO SOLN
INTRAOCULAR | Status: DC | PRN
  Administered 2022-10-28: 15 mL via INTRAOCULAR

## 2022-10-28 MED ORDER — LACTATED RINGERS IV SOLN: INTRAVENOUS | Status: DC

## 2022-10-28 MED ORDER — PHENYLEPHRINE-KETOROLAC 1-0.3 % IO SOLN
INTRAOCULAR | Status: DC | PRN
  Administered 2022-10-28: 90 mL via OPHTHALMIC

## 2022-10-28 MED ORDER — FENTANYL CITRATE (PF) 100 MCG/2ML IJ SOLN
INTRAMUSCULAR | Status: DC | PRN
  Administered 2022-10-28: 25 ug via INTRAVENOUS
  Administered 2022-10-28 (×2): 12.5 ug via INTRAVENOUS

## 2022-10-28 MED ORDER — MIDAZOLAM HCL 2 MG/2ML IJ SOLN
INTRAMUSCULAR | Status: DC | PRN
  Administered 2022-10-28: 1 mg via INTRAVENOUS

## 2022-10-28 MED ORDER — TETRACAINE HCL 0.5 % OP SOLN
1.0000 [drp] | OPHTHALMIC | Status: DC | PRN
  Administered 2022-10-28 (×3): 1 [drp] via OPHTHALMIC

## 2022-10-28 MED ORDER — LIDOCAINE HCL (PF) 2 % IJ SOLN
INTRAOCULAR | Status: DC | PRN
  Administered 2022-10-28: 4 mL via INTRAOCULAR

## 2022-10-28 MED ORDER — MOXIFLOXACIN HCL 0.5 % OP SOLN
OPHTHALMIC | Status: DC | PRN
  Administered 2022-10-28: .2 mL via OPHTHALMIC

## 2022-10-28 MED ORDER — BRIMONIDINE TARTRATE-TIMOLOL 0.2-0.5 % OP SOLN
OPHTHALMIC | Status: DC | PRN
  Administered 2022-10-28: 1 [drp] via OPHTHALMIC

## 2022-10-28 MED ORDER — SIGHTPATH DOSE#1 NA HYALUR & NA CHOND-NA HYALUR IO KIT
PACK | INTRAOCULAR | Status: DC | PRN
  Administered 2022-10-28: 1 via OPHTHALMIC

## 2022-10-28 SURGICAL SUPPLY — 10 items
CATARACT SUITE SIGHTPATH (MISCELLANEOUS) ×1 IMPLANT
DISSECTOR HYDRO NUCLEUS 50X22 (MISCELLANEOUS) ×1 IMPLANT
DRSG TEGADERM 2-3/8X2-3/4 SM (GAUZE/BANDAGES/DRESSINGS) ×1 IMPLANT
FEE CATARACT SUITE SIGHTPATH (MISCELLANEOUS) ×1 IMPLANT
GLOVE SURG SYN 7.5 E (GLOVE) ×1 IMPLANT
GLOVE SURG SYN 7.5 PF PI (GLOVE) ×1 IMPLANT
GLOVE SURG SYN 8.5 E (GLOVE) ×1 IMPLANT
GLOVE SURG SYN 8.5 PF PI (GLOVE) ×1 IMPLANT
LENS CLAREON TORIC CNW0T4 17.5 ×1 IMPLANT
LENS IOL CLRN TRC 4 17.5 IMPLANT

## 2022-10-28 NOTE — Anesthesia Postprocedure Evaluation (Signed)
Anesthesia Post Note  Patient: Ottie Neglia  Procedure(s) Performed: CATARACT EXTRACTION PHACO AND INTRAOCULAR LENS PLACEMENT (IOC) LEFT OMIDRIA  CLAREON TORIC LENS 7.93 01:11.3 (Left)  Patient location during evaluation: PACU Anesthesia Type: MAC Level of consciousness: awake and alert Pain management: pain level controlled Vital Signs Assessment: post-procedure vital signs reviewed and stable Respiratory status: spontaneous breathing, nonlabored ventilation, respiratory function stable and patient connected to nasal cannula oxygen Cardiovascular status: stable and blood pressure returned to baseline Postop Assessment: no apparent nausea or vomiting Anesthetic complications: no   No notable events documented.   Last Vitals:  Vitals:   10/28/22 0839 10/28/22 0844  BP: (!) 134/97 (!) 139/96  Pulse: 70 68  Resp: 18 15  Temp: 36.4 C (!) 36.1 C  SpO2: 96% 96%    Last Pain:  Vitals:   10/28/22 0844  TempSrc:   PainSc: 0-No pain                 Senon Nixon C Tasha Diaz

## 2022-10-28 NOTE — Transfer of Care (Signed)
Immediate Anesthesia Transfer of Care Note  Patient: Amber Pollard  Procedure(s) Performed: CATARACT EXTRACTION PHACO AND INTRAOCULAR LENS PLACEMENT (IOC) LEFT OMIDRIA  CLAREON TORIC LENS 7.93 01:11.3 (Left)  Patient Location: PACU  Anesthesia Type: MAC  Level of Consciousness: awake, alert  and patient cooperative  Airway and Oxygen Therapy: Patient Spontanous Breathing and Patient connected to supplemental oxygen  Post-op Assessment: Post-op Vital signs reviewed, Patient's Cardiovascular Status Stable, Respiratory Function Stable, Patent Airway and No signs of Nausea or vomiting  Post-op Vital Signs: Reviewed and stable  Complications: No notable events documented.

## 2022-10-28 NOTE — H&P (Signed)
[facility]   Primary Care Physician:  [facility] Ophthalmologist: Dr. Deberah Pelton  Pre-Procedure History & Physical: HPI:  Amber Pollard is a 82 y.o. female here for cataract surgery.   Past Medical History:  Diagnosis Date   CKD (chronic kidney disease), stage III (HCC)    Environmental allergies    Hernia of abdominal wall    Hypertension    Hyperthyroidism    Wears dentures    full upper and lower    Past Surgical History:  Procedure Laterality Date   ABDOMINAL HYSTERECTOMY     BLADDER REPAIR     CATARACT EXTRACTION W/PHACO Right 09/20/2022   Procedure: CATARACT EXTRACTION PHACO AND INTRAOCULAR LENS PLACEMENT (IOC) RIGHT OMIDRIA  CLAREON TORIC LENS  12.25  01.08.6;  Surgeon: Estanislado Pandy, MD;  Location: [facility];  Service: Ophthalmology;  Laterality: Right;   CESAREAN SECTION     large bowel surgery      Prior to Admission medications   Medication Sig Start Date End Date Taking? Authorizing Provider  amLODipine (NORVASC) 2.5 MG tablet Take 2.5 mg by mouth daily.   Yes [provider]  cholecalciferol (VITAMIN D3) 25 MCG (1000 UNIT) tablet Take 1,000 Units by mouth daily.   Yes [provider]  cyanocobalamin 100 MCG tablet Take 1 tablet by mouth daily.   Yes [provider]  Docusate Calcium (STOOL SOFTENER PO) Take by mouth daily.   Yes [provider]  methimazole (TAPAZOLE) 5 MG tablet Take 2.5 mg by mouth daily. 10/23/20  Yes [provider]  montelukast (SINGULAIR) 10 MG tablet Take 10 mg by mouth daily. 10/23/20  Yes [provider]  triamcinolone cream (KENALOG) 0.1 % Apply 1 application topically 2 (two) times daily. Patient not taking: Reported on 09/15/2022 12/26/20   Eusebio Friendly B, PA-C    Allergies as of 08/05/2022 - Review Complete 09/29/2021  Allergen Reaction Noted   Fish allergy Rash 12/09/2014    History reviewed. No pertinent family history.  Social History    Socioeconomic History   Marital status: Single    Spouse name: Not on file   Number of children: Not on file   Years of education: Not on file   Highest education level: Not on file  Occupational History   Not on file  Tobacco Use   Smoking status: Former    Current packs/day: 0.00    Types: Cigarettes    Quit date: 74    Years since quitting: 32.5   Smokeless tobacco: Never   Tobacco comments:    Smoked for approx 30 yrs  Vaping Use   Vaping status: Never Used  Substance and Sexual Activity   Alcohol use: No   Drug use: Never   Sexual activity: Not on file  Other Topics Concern   Not on file  Social History Narrative   Not on file   Social Determinants of Health   Financial Resource Strain: Low Risk  (01/30/2022)   Received from [facility], [facility]   Overall Financial Resource Strain (CARDIA)    Difficulty of Paying Living Expenses: Not hard at all  Food Insecurity: No Food Insecurity (01/30/2022)   Received from [facility], [facility]   Hunger Vital Sign    Worried About Running Out of Food in the Last Year: Never true    Ran Out of Food in the Last Year: Never true  Transportation Needs: No Transportation  Needs (01/30/2022)   Received from [facility], [facility]   [facility] - Transportation    In the past 12 months, has lack of transportation kept you from medical appointments or from getting medications?: No    Lack of Transportation (Non-Medical): No  Physical Activity: Not on file  Stress: Not on file  Social Connections: Not on file  Intimate Partner Violence: Not on file    Review of Systems: See HPI, otherwise negative ROS  Physical Exam: BP (!) 161/83   Temp 97.8 F (36.6 C) (Temporal)   Resp 13   Ht 5\' 1"  (1.549 m)   Wt 55.3 kg   SpO2 99%   BMI 23.05 kg/m  General:   Alert, cooperative in NAD Head:  Normocephalic and  atraumatic. Respiratory:  Normal work of breathing. Cardiovascular:  RRR  Impression/Plan: Amber Pollard is here for cataract surgery.  Risks, benefits, limitations, and alternatives regarding cataract surgery have been reviewed with the patient.  Questions have been answered.  All parties agreeable.   Estanislado Pandy, MD  10/28/2022, 7:08 AM

## 2022-10-28 NOTE — Op Note (Signed)
OPERATIVE NOTE  Amber Pollard [id number] 10/28/2022   PREOPERATIVE DIAGNOSIS: Nuclear sclerotic cataract left eye. H25.12   POSTOPERATIVE DIAGNOSIS: Nuclear sclerotic cataract left eye. 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 Item Serial No. Manufacturer Lot No. LRB No. Used Action  CLAREON TORIC IOL   [id number] ALCON  Left 1 Implanted    [id number] Toric intraocular lens with 2.25 diopters of cylindrical power with axis orientation at 035 degrees.  SURGEON:  Julious Payer. Rolley Sims, MD   ANESTHESIA:  Topical with tetracaine drops, augmented with 1% preservative-free intracameral lidocaine.   COMPLICATIONS:  None.   DESCRIPTION OF PROCEDURE:  The patient was identified in the holding room and transported to the operating room and placed in the supine position under the operating microscope.  The left eye was identified as the operative eye, which was prepped and draped in the usual sterile ophthalmic fashion.   A 1 millimeter clear-corneal paracentesis was made inferotemporally. Preservative-free 1% lidocaine mixed with 1:1,000 bisulfite-free aqueous solution of epinephrine was injected into the anterior chamber. The anterior chamber was then filled with Viscoat viscoelastic. A 2.4 millimeter keratome was used to make a clear-corneal incision superotemporally. A curvilinear capsulorrhexis was made with a cystotome and capsulorrhexis forceps. Balanced salt solution was used to hydrodissect and hydrodelineate the nucleus. Phacoemulsification was then used to remove the lens nucleus and epinucleus. The remaining cortex was then removed using the irrigation and aspiration handpiece. Provisc was then placed into the capsular bag to distend it for lens  placement. The Verion digital marker was used to align the implant at the intended axis.  A +17.50 D [id number] Toric lens was then injected into the capsular bag.  It was rotated clockwise until the axis marks on the lens were approximately 15 degrees in the counterclockwise direction to the intended alignment.  The viscoelastic was aspirated from the eye using the irrigation aspiration handpiece.  Then, a blunt chopper through the sideport incision was used to rotate the lens in a clockwise direction until the axis markings of the intraocular lens were lined up with the Verion alignment.  Balanced salt solution was then used to hydrate the wounds.   The anterior chamber was inflated to a physiologic pressure with balanced salt solution.  No wound leaks were noted. Vigamox was injected intracamerally.  Timolol and Brimonidine drops were applied to the eye.  The patient was taken to the recovery room in stable condition without complications of anesthesia or surgery.  [name] 10/28/2022, 8:37 AM

## 2022-11-02 ENCOUNTER — Encounter: Payer: Self-pay | Admitting: Ophthalmology

## 2023-02-10 ENCOUNTER — Other Ambulatory Visit: Payer: Self-pay | Admitting: Internal Medicine

## 2023-02-10 DIAGNOSIS — Z1231 Encounter for screening mammogram for malignant neoplasm of breast: Secondary | ICD-10-CM

## 2023-06-29 ENCOUNTER — Ambulatory Visit
Admission: RE | Admit: 2023-06-29 | Discharge: 2023-06-29 | Disposition: A | Discharge status: Home / Self Care | Source: Ambulatory Visit | Attending: Internal Medicine | Admitting: Internal Medicine

## 2023-06-29 DIAGNOSIS — Z1231 Encounter for screening mammogram for malignant neoplasm of breast: Secondary | ICD-10-CM | POA: Diagnosis present

## 2023-06-30 ENCOUNTER — Inpatient Hospital Stay
Admission: RE | Admit: 2023-06-30 | Discharge: 2023-06-30 | Disposition: A | Discharge status: Home / Self Care | Payer: Self-pay | Source: Ambulatory Visit | Attending: Internal Medicine | Admitting: Internal Medicine

## 2023-06-30 ENCOUNTER — Other Ambulatory Visit: Payer: Self-pay | Admitting: *Deleted

## 2023-06-30 DIAGNOSIS — Z1231 Encounter for screening mammogram for malignant neoplasm of breast: Secondary | ICD-10-CM

## 2023-07-02 ENCOUNTER — Ambulatory Visit (INDEPENDENT_AMBULATORY_CARE_PROVIDER_SITE_OTHER)

## 2023-07-02 ENCOUNTER — Ambulatory Visit
Admission: EM | Admit: 2023-07-02 | Discharge: 2023-07-02 | Disposition: A | Discharge status: Home / Self Care | Attending: Emergency Medicine | Admitting: Emergency Medicine

## 2023-07-02 DIAGNOSIS — S7001XA Contusion of right hip, initial encounter: Secondary | ICD-10-CM | POA: Diagnosis not present

## 2023-07-02 DIAGNOSIS — M25551 Pain in right hip: Secondary | ICD-10-CM

## 2023-07-02 MED ORDER — DICLOFENAC SODIUM 1 % EX GEL: 4.0000 g | Freq: Four times a day (QID) | CUTANEOUS | 0 refills | Status: AC

## 2023-07-02 NOTE — Discharge Instructions (Addendum)
 Your hip x-rays did not show any evidence of fracture or broken bone.  I do believe you have bruised your hip as a result of your fall.  You may apply ice to your hip for 20 minutes at a time, 2-3 times a day, to up with pain and inflammation.  You may also apply topical diclofenac gel, 4 g to your hip every 6 hours, as needed for pain or inflammation.  If your symptoms do not improve, or they worsen, either return for reevaluation or follow-up with orthopedics such as [facility] here in [place] or in [place].

## 2023-07-02 NOTE — ED Provider Notes (Addendum)
 [facility]    CSN: [id number] Arrival date & time: 07/02/23  1144      History   Chief Complaint Chief Complaint  Patient presents with   Fall    HPI Amber Pollard is a 83 y.o. female.   HPI  83 year old female with past medical history significant for hypertension, hypothyroidism, and chronic kidney disease stage III presents for evaluation of pain in the posterior aspect of her right hip that started last night when she attempted to get herself out of her chair.  10 days ago she suffered a ground-level fall that resulted in her falling flat on her stomach.  She did strike the toilet in her bathroom with her right upper arm but is unsure if her right hip impacted anything.  She was able to get herself up following the fall and did not have any pain until last night.  She does have pain with change in position, weightbearing, and ambulation.  No numbness or tingling.  No bruising.  Past Medical History:  Diagnosis Date   CKD (chronic kidney disease), stage III (HCC)    Environmental allergies    Hernia of abdominal wall    Hypertension    Hyperthyroidism    Wears dentures    full upper and lower    There are no active problems to display for this patient.   Past Surgical History:  Procedure Laterality Date   ABDOMINAL HYSTERECTOMY     BLADDER REPAIR     CATARACT EXTRACTION W/PHACO Right 09/20/2022   Procedure: CATARACT EXTRACTION PHACO AND INTRAOCULAR LENS PLACEMENT (IOC) RIGHT OMIDRIA  CLAREON TORIC LENS  12.25  01.08.6;  Surgeon: Estanislado Pandy, MD;  Location: [facility];  Service: Ophthalmology;  Laterality: Right;   CATARACT EXTRACTION W/PHACO Left 10/28/2022   Procedure: CATARACT EXTRACTION PHACO AND INTRAOCULAR LENS PLACEMENT (IOC) LEFT OMIDRIA  CLAREON TORIC LENS 7.93 01:11.3;  Surgeon: Estanislado Pandy, MD;  Location: [facility];  Service: Ophthalmology;  Laterality: Left;   CESAREAN SECTION     large bowel surgery       OB History   No obstetric history on file.      Home Medications    Prior to Admission medications   Medication Sig Start Date End Date Taking? Authorizing Provider  diclofenac Sodium (VOLTAREN) 1 % GEL Apply 4 g topically 4 (four) times daily. 07/02/23  Yes Becky Augusta, NP  amLODipine (NORVASC) 2.5 MG tablet Take 2.5 mg by mouth daily.    [provider]  cholecalciferol (VITAMIN D3) 25 MCG (1000 UNIT) tablet Take 1,000 Units by mouth daily.    [provider]  cyanocobalamin 100 MCG tablet Take 1 tablet by mouth daily.    [provider]  Docusate Calcium (STOOL SOFTENER PO) Take by mouth daily.    [provider]  methimazole (TAPAZOLE) 5 MG tablet Take 2.5 mg by mouth daily. 10/23/20   [provider]  montelukast (SINGULAIR) 10 MG tablet Take 10 mg by mouth daily. 10/23/20   [provider]  triamcinolone cream (KENALOG) 0.1 % Apply 1 application topically 2 (two) times daily. Patient not taking: Reported on 09/15/2022 12/26/20   Gareth Morgan    Family History History reviewed. No pertinent family history.  Social History Social History   Tobacco Use   Smoking status: Former    Current packs/day: 0.00    Types: Cigarettes    Quit date: 1992    Years since quitting: 37.2  Smokeless tobacco: Never   Tobacco comments:    Smoked for approx 30 yrs  Vaping Use   Vaping status: Never Used  Substance Use Topics   Alcohol use: No   Drug use: Never     Allergies   Fish-derived products and Fish allergy   Review of Systems Review of Systems  Musculoskeletal:  Positive for arthralgias. Negative for joint swelling.  Skin:  Negative for color change.  Neurological:  Negative for weakness and numbness.     Physical Exam Triage Vital Signs ED Triage Vitals  Encounter Vitals Group     BP      Systolic BP Percentile      Diastolic BP Percentile      Pulse      Resp      Temp      Temp src      SpO2       Weight      Height      Head Circumference      Peak Flow      Pain Score      Pain Loc      Pain Education      Exclude from Growth Chart    No data found.  Updated Vital Signs BP (!) 145/90 (BP Location: Left Arm)   Pulse 85   Temp 97.6 F (36.4 C) (Temporal)   SpO2 97%   Visual Acuity Right Eye Distance:   Left Eye Distance:   Bilateral Distance:    Right Eye Near:   Left Eye Near:    Bilateral Near:     Physical Exam Vitals and nursing note reviewed.  Constitutional:      Appearance: Normal appearance. She is not ill-appearing.  HENT:     Head: Normocephalic and atraumatic.  Musculoskeletal:        General: Tenderness and signs of injury present. No swelling or deformity.  Skin:    General: Skin is warm and dry.     Capillary Refill: Capillary refill takes less than 2 seconds.     Findings: No bruising or erythema.  Neurological:     General: No focal deficit present.     Mental Status: She is alert and oriented to person, place, and time.      UC Treatments / Results  Labs (all labs ordered are listed, but only abnormal results are displayed) Labs Reviewed - No data to display  EKG   Radiology No results found.  Procedures Procedures (including critical care time)  Medications Ordered in UC Medications - No data to display  Initial Impression / Assessment and Plan / UC Course  I have reviewed the triage vital signs and the nursing notes.  Pertinent labs & imaging results that were available during my care of the patient were reviewed by me and considered in my medical decision making (see chart for details).   Patient is a pleasant, nontoxic-appearing 83 year old female presenting for evaluation of pain in the posterior aspect of her right hip as outlined HPI above.  The patient does not display any internal rotation or shortening.  DP and PT pulses in her right lower extremity are 2+.  The patient does have pain in her right gluteus  muscle as well as some significant spasm in the right gluteus with change of position and standing.  She is able to bear weight on her right leg though with pain.  No pain with palpation of the greater trochanter or anterior hip joint.  She does have pain with palpation of the posterior aspect.  No pain with palpation of the iliac crest.  No appreciable bruising.  I suspect that the patient's pain is musculoskeletal in nature, though given that she suffered a ground-level fall I will obtain a radiograph of her right hip to rule out any bony abnormality.  Right hip films independently reviewed and evaluated by me.  Impression: No evidence of hip fracture or dislocation.  There is an osteophyte present at the superior posterior aspect of the patient's acetabulum.  Radiology overread is pending. 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  I will discharge patient on the diagnosis of contusion of the right hip and have her use topical diclofenac gel 4 times a day to her right hip.  She may also apply ice to the hip to help with pain and inflammation.  If her symptoms do not improve, or they worsen, she can return for reevaluation or follow-up with her primary care provider.   Final Clinical Impressions(s) / UC Diagnoses   Final diagnoses:  Right hip pain  Contusion of right hip, initial encounter     Discharge Instructions      Your hip x-rays did not show any evidence of fracture or broken bone.  I do believe you have bruised your hip as a result of your fall.  You may apply ice to your hip for 20 minutes at a time, 2-3 times a day, to up with pain and inflammation.  You may also apply topical diclofenac gel, 4 g to your hip every 6 hours, as needed for pain or inflammation.  If your symptoms do not improve, or they worsen, either return for reevaluation or follow-up with orthopedics such as [facility] here in [place] or in [place].     ED Prescriptions      Medication Sig Dispense Auth. Provider   diclofenac Sodium (VOLTAREN) 1 % GEL Apply 4 g topically 4 (four) times daily. 100 g Becky Augusta, NP      PDMP not reviewed this encounter.   Becky Augusta, NP 07/02/23 1245    Becky Augusta, NP 07/02/23 [phone]

## 2023-07-02 NOTE — ED Triage Notes (Signed)
 Patient presents to UC for right sided hip pain since last night. Reports she was getting up from chair she developed severe hip pain. States she has used a heat pad that has provided some relief. She reports she did fall 10 days ago.

## 2023-11-12 ENCOUNTER — Encounter: Payer: Self-pay | Admitting: Emergency Medicine

## 2023-11-12 ENCOUNTER — Emergency Department

## 2023-11-12 ENCOUNTER — Emergency Department
Admission: EM | Admit: 2023-11-12 | Discharge: 2023-11-12 | Disposition: A | Discharge status: Home / Self Care | Attending: Emergency Medicine | Admitting: Emergency Medicine

## 2023-11-12 ENCOUNTER — Ambulatory Visit
Admission: EM | Admit: 2023-11-12 | Discharge: 2023-11-12 | Disposition: A | Discharge status: Other Acute Inpatient Hospital | Attending: Family Medicine | Admitting: Family Medicine

## 2023-11-12 DIAGNOSIS — R079 Chest pain, unspecified: Secondary | ICD-10-CM | POA: Diagnosis not present

## 2023-11-12 DIAGNOSIS — E871 Hypo-osmolality and hyponatremia: Secondary | ICD-10-CM | POA: Insufficient documentation

## 2023-11-12 DIAGNOSIS — E039 Hypothyroidism, unspecified: Secondary | ICD-10-CM | POA: Insufficient documentation

## 2023-11-12 DIAGNOSIS — R0602 Shortness of breath: Secondary | ICD-10-CM | POA: Diagnosis not present

## 2023-11-12 DIAGNOSIS — R5383 Other fatigue: Secondary | ICD-10-CM | POA: Diagnosis not present

## 2023-11-12 DIAGNOSIS — I129 Hypertensive chronic kidney disease with stage 1 through stage 4 chronic kidney disease, or unspecified chronic kidney disease: Secondary | ICD-10-CM | POA: Insufficient documentation

## 2023-11-12 DIAGNOSIS — R0789 Other chest pain: Secondary | ICD-10-CM | POA: Diagnosis present

## 2023-11-12 DIAGNOSIS — D72829 Elevated white blood cell count, unspecified: Secondary | ICD-10-CM | POA: Insufficient documentation

## 2023-11-12 DIAGNOSIS — N189 Chronic kidney disease, unspecified: Secondary | ICD-10-CM | POA: Insufficient documentation

## 2023-11-12 DIAGNOSIS — I447 Left bundle-branch block, unspecified: Secondary | ICD-10-CM | POA: Diagnosis not present

## 2023-11-12 LAB — BASIC METABOLIC PANEL WITH GFR
Anion gap: 12 (ref 5–15)
BUN: 20 mg/dL (ref 8–23)
CO2: 21 mmol/L — ABNORMAL LOW (ref 22–32)
Calcium: 9.5 mg/dL (ref 8.9–10.3)
Chloride: 100 mmol/L (ref 98–111)
Creatinine, Ser: 1.07 mg/dL — ABNORMAL HIGH (ref 0.44–1.00)
GFR, Estimated: 52 mL/min — ABNORMAL LOW (ref >60)
Glucose, Bld: 137 mg/dL — ABNORMAL HIGH (ref 70–99)
Potassium: 3.7 mmol/L (ref 3.5–5.1)
Sodium: 133 mmol/L — ABNORMAL LOW (ref 135–145)

## 2023-11-12 LAB — CBC
HCT: 37.9 % (ref 36.0–46.0)
Hemoglobin: 13.1 g/dL (ref 12.0–15.0)
MCH: 29 pg (ref 26.0–34.0)
MCHC: 34.6 g/dL (ref 30.0–36.0)
MCV: 84 fL (ref 80.0–100.0)
Platelets: 254 K/uL (ref 150–400)
RBC: 4.51 MIL/uL (ref 3.87–5.11)
RDW: 12.3 % (ref 11.5–15.5)
WBC: 11.5 K/uL — ABNORMAL HIGH (ref 4.0–10.5)
nRBC: 0 % (ref 0.0–0.2)

## 2023-11-12 LAB — TROPONIN I (HIGH SENSITIVITY): Troponin I (High Sensitivity): 10 ng/L (ref <18)

## 2023-11-12 MED ORDER — LOSARTAN POTASSIUM 50 MG PO TABS: 50.0000 mg | ORAL_TABLET | Freq: Every day | ORAL | 11 refills | Status: AC

## 2023-11-12 MED ORDER — ASPIRIN 81 MG PO CHEW
324.0000 mg | CHEWABLE_TABLET | Freq: Once | ORAL | Status: AC
  Administered 2023-11-12: 324 mg via ORAL

## 2023-11-12 NOTE — ED Provider Notes (Signed)
 [facility] Provider Note   Event Date/Time   First MD Initiated Contact with Patient 11/12/23 1333     (approximate) History  No chief complaint on file.  HPI Amber Pollard is a 83 y.o. female with past medical history of hypothyroidism, CKD, and hypertension who presents complaining of left-sided chest pain that does not radiate and has no exertional worsening.  Patient states that this chest pain began yesterday last a few hours before resolving spontaneously.  Patient states that this morning this pain recurred and has not abated yet.  Patient states that it has improved from onset where it was a 5/10 and is now a 1-2/10.  Patient did receive sublingual nitroglycerin that she states seemed to help with this pain however that was more than 3 hours ago.  Denies any associated shortness of breath or dyspnea on exertion. ROS: Patient currently denies any vision changes, tinnitus, difficulty speaking, facial droop, sore throat, shortness of breath, abdominal pain, nausea/vomiting/diarrhea, dysuria, or weakness/numbness/paresthesias in any extremity   Physical Exam  Triage Vital Signs: ED Triage Vitals [11/12/23 1300]  Encounter Vitals Group     BP 110/63     Girls Systolic BP Percentile      Girls Diastolic BP Percentile      Boys Systolic BP Percentile      Boys Diastolic BP Percentile      Pulse Rate 87     Resp 15     Temp 98 F (36.7 C)     Temp Source Oral     SpO2 95 %     Weight      Height      Head Circumference      Peak Flow      Pain Score 4     Pain Loc      Pain Education      Exclude from Growth Chart    Most recent vital signs: Vitals:   11/12/23 1339 11/12/23 1430  BP:  112/66  Pulse:  72  Resp:  (!) 24  Temp:    SpO2: 100% 100%   General: Awake, oriented x4. CV:  Good peripheral perfusion. Resp:  Normal effort. Abd:  No distention. Other:  Elderly well-developed, well-nourished Caucasian female resting comfortably in no acute  distress ED Results / Procedures / Treatments  Labs (all labs ordered are listed, but only abnormal results are displayed) Labs Reviewed  BASIC METABOLIC PANEL WITH GFR - Abnormal; Notable for the following components:      Result Value   Sodium 133 (*)    CO2 21 (*)    Glucose, Bld 137 (*)    Creatinine, Ser 1.07 (*)    GFR, Estimated 52 (*)    All other components within normal limits  CBC - Abnormal; Notable for the following components:   WBC 11.5 (*)    All other components within normal limits  TROPONIN I (HIGH SENSITIVITY)   EKG ED ECG REPORT I, Artist MARLA Kerns, the attending physician, personally viewed and interpreted this ECG. Date: 11/12/2023 EKG Time: 1200 Rate: 79 Rhythm: normal sinus rhythm QRS Axis: normal Intervals: Left bundle branch block ST/T Wave abnormalities: normal Narrative Interpretation: Normal sinus rhythm with LBBB.  No evidence of acute ischemia RADIOLOGY ED MD interpretation: 2 view chest x-ray interpreted by me shows no evidence of acute abnormalities including no pneumonia, pneumothorax, or widened mediastinum - All radiology independently interpreted and agree with radiology assessment Official radiology report(s): No results found.  PROCEDURES: Critical Care performed: No Procedures MEDICATIONS ORDERED IN ED: Medications - No data to display IMPRESSION / MDM / ASSESSMENT AND PLAN / ED COURSE  I reviewed the triage vital signs and the nursing notes.                             The patient is on the cardiac monitor to evaluate for evidence of arrhythmia and/or significant heart rate changes. Patient's presentation is most consistent with acute presentation with potential threat to life or bodily function. Workup: ECG, CXR, CBC, BMP, Troponin Findings: ECG: No overt evidence of STEMI. No evidence of Brugada's sign, delta wave, epsilon wave, significantly prolonged QTc, or malignant arrhythmia HS Troponin: Negative x1 Other Labs  unremarkable for emergent problems. CXR: Without PTX, PNA, or widened mediastinum Last Stress Test: Denies Last Heart Catheterization: Denies HEART Score: 4  Given History, Exam, and Workup I have low suspicion for ACS, Pneumothorax, Pneumonia, Pulmonary Embolus, Tamponade, Aortic Dissection or other emergent problem as a cause for this presentation.   Reassesment: Prior to discharge patient's pain was controlled and they were well appearing.  Disposition:  Discharge. Strict return precautions discussed with patient with full understanding. Advised patient to follow up promptly with primary care provider    FINAL CLINICAL IMPRESSION(S) / ED DIAGNOSES   Final diagnoses:  Intermittent left-sided chest pain   Rx / DC Orders   ED Discharge Orders          Ordered    Ambulatory referral to Cardiology       Comments: If you have not heard from the Cardiology office within the next 72 hours please call [phone].   11/12/23 1434    losartan  (COZAAR ) 50 MG tablet  Daily        11/12/23 1434           Note:  This document was prepared using Dragon voice recognition software and may include unintentional dictation errors.   Jossie Artist POUR, MD 11/13/23 [phone]

## 2023-11-12 NOTE — ED Provider Notes (Signed)
 [facility]    CSN: [id number] Arrival date & time: 11/12/23  1151      History   Chief Complaint Chief Complaint  Patient presents with   Chest Pain   Shortness of Breath    HPI Amber Pollard is a 83 y.o. female with history of chronic kidney disease stage III, hypertension and hypothyroidism.  She presents today for 5 out of 10 chest pain that has lasted beyond 24 hours.  She reports associated fatigue, shortness of breath and not feeling well.  No report of headaches, dizziness, numbness/tingling or weakness.  No abdominal pain, nausea/vomiting, sweats, fever, cough or congestion.  No history of heart attack, stroke, PE or DVT.  Has not taken anything for her chest pain.  It has remained the same since yesterday and she feels it more on the left side of her chest.  Patient drove herself to the urgent care today.  HPI  Past Medical History:  Diagnosis Date   CKD (chronic kidney disease), stage III (HCC)    Environmental allergies    Hernia of abdominal wall    Hypertension    Hyperthyroidism    Wears dentures    full upper and lower    There are no active problems to display for this patient.   Past Surgical History:  Procedure Laterality Date   ABDOMINAL HYSTERECTOMY     BLADDER REPAIR     CATARACT EXTRACTION W/PHACO Right 09/20/2022   Procedure: CATARACT EXTRACTION PHACO AND INTRAOCULAR LENS PLACEMENT (IOC) RIGHT OMIDRIA   CLAREON TORIC LENS  12.25  01.08.6;  Surgeon: Enola Feliciano Hugger, MD;  Location: [facility];  Service: Ophthalmology;  Laterality: Right;   CATARACT EXTRACTION W/PHACO Left 10/28/2022   Procedure: CATARACT EXTRACTION PHACO AND INTRAOCULAR LENS PLACEMENT (IOC) LEFT OMIDRIA   CLAREON TORIC LENS 7.93 01:11.3;  Surgeon: Enola Feliciano Hugger, MD;  Location: [facility];  Service: Ophthalmology;  Laterality: Left;   CESAREAN SECTION     large bowel surgery      OB History   No obstetric history on file.      Home  Medications    Prior to Admission medications   Medication Sig Start Date End Date Taking? Authorizing Provider  methimazole (TAPAZOLE) 5 MG tablet Take 2.5 mg by mouth daily. 10/23/20  Yes [provider]  montelukast (SINGULAIR) 10 MG tablet Take 10 mg by mouth daily. 10/23/20  Yes [provider]  amLODipine (NORVASC) 2.5 MG tablet Take 2.5 mg by mouth daily.    [provider]  cholecalciferol (VITAMIN D3) 25 MCG (1000 UNIT) tablet Take 1,000 Units by mouth daily.    [provider]  cyanocobalamin 100 MCG tablet Take 1 tablet by mouth daily.    [provider]  diclofenac  Sodium (VOLTAREN ) 1 % GEL Apply 4 g topically 4 (four) times daily. 07/02/23   Bernardino Ditch, NP  Docusate Calcium (STOOL SOFTENER PO) Take by mouth daily.    [provider]  losartan  (COZAAR ) 50 MG tablet Take 1 tablet (50 mg total) by mouth daily. 11/12/23 11/11/24  Bradler, Evan K, MD  triamcinolone  cream (KENALOG ) 0.1 % Apply 1 application topically 2 (two) times daily. Patient not taking: Reported on 09/15/2022 12/26/20   Arvis Jolan KATHEE DEVONNA    Family History History reviewed. No pertinent family history.  Social History Social History   Tobacco Use   Smoking status: Former    Current packs/day: 0.00    Types: Cigarettes    Quit date:  1992    Years since quitting: 33.6   Smokeless tobacco: Never   Tobacco comments:    Smoked for approx 30 yrs  Vaping Use   Vaping status: Never Used  Substance Use Topics   Alcohol use: No   Drug use: Never     Allergies   Fish-derived products and Fish allergy   Review of Systems Review of Systems  Constitutional:  Positive for fatigue. Negative for fever.  HENT:  Negative for congestion.   Respiratory:  Positive for chest tightness and shortness of breath. Negative for cough.   Cardiovascular:  Positive for chest pain. Negative for palpitations.  Gastrointestinal:  Negative for nausea and vomiting.   Neurological:  Negative for dizziness, syncope, weakness, numbness and headaches.     Physical Exam Triage Vital Signs ED Triage Vitals  Encounter Vitals Group     BP 11/12/23 1159 137/75     Girls Systolic BP Percentile --      Girls Diastolic BP Percentile --      Boys Systolic BP Percentile --      Boys Diastolic BP Percentile --      Pulse Rate 11/12/23 1159 87     Resp 11/12/23 1159 14     Temp 11/12/23 1159 98 F (36.7 C)     Temp Source 11/12/23 1159 Oral     SpO2 11/12/23 1159 97 %     Weight 11/12/23 1158 121 lb 14.6 oz (55.3 kg)     Height 11/12/23 1158 5' 1 (1.549 m)     Head Circumference --      Peak Flow --      Pain Score 11/12/23 1158 5     Pain Loc --      Pain Education --      Exclude from Growth Chart --    No data found.  Updated Vital Signs BP 137/75 (BP Location: Left Arm)   Pulse 87   Temp 98 F (36.7 C) (Oral)   Resp 14   Ht 5' 1 (1.549 m)   Wt 121 lb 14.6 oz (55.3 kg)   SpO2 97%   BMI 23.04 kg/m     Physical Exam Vitals and nursing note reviewed.  Constitutional:      General: She is not in acute distress.    Appearance: Normal appearance. She is not ill-appearing or toxic-appearing.  HENT:     Head: Normocephalic and atraumatic.     Nose: Nose normal.     Mouth/Throat:     Mouth: Mucous membranes are moist.     Pharynx: Oropharynx is clear.  Eyes:     General: No scleral icterus.       Right eye: No discharge.        Left eye: No discharge.     Conjunctiva/sclera: Conjunctivae normal.  Cardiovascular:     Rate and Rhythm: Normal rate and regular rhythm.     Heart sounds: Normal heart sounds.  Pulmonary:     Effort: Pulmonary effort is normal. No respiratory distress.     Breath sounds: Normal breath sounds.  Musculoskeletal:     Cervical back: Neck supple.     Right lower leg: No edema.     Left lower leg: No edema.  Skin:    General: Skin is dry.  Neurological:     General: No focal deficit present.     Mental  Status: She is alert. Mental status is at baseline.     Motor: No weakness.  Gait: Gait normal.  Psychiatric:        Mood and Affect: Mood normal.        Behavior: Behavior normal.      UC Treatments / Results  Labs (all labs ordered are listed, but only abnormal results are displayed) Labs Reviewed - No data to display  EKG   Radiology DG Chest 2 View Result Date: 11/12/2023 CLINICAL DATA:  Chest pain. EXAM: CHEST - 2 VIEW COMPARISON:  09/29/2021. FINDINGS: The heart size and mediastinal contours are within normal limits. Aortic atherosclerosis. No focal consolidation, pleural effusion, or pneumothorax. Diffuse osseous demineralization. Pectus excavatum deformity. No acute osseous abnormality. IMPRESSION: No acute cardiopulmonary findings. Electronically Signed   By: Harrietta Sherry M.D.   On: 11/12/2023 13:37    Procedures ED EKG  Date/Time: 11/12/2023 12:07 PM  Performed by: Arvis Jolan NOVAK, PA-C Authorized by: Bluford Jacqulyn MATSU, DO   Previous ECG:    Previous ECG:  Compared to current   Similarity:  No change   Comparison ECG info:  2023 Interpretation:    Interpretation: abnormal   Rate:    ECG rate:  79   ECG rate assessment: normal   Rhythm:    Rhythm: sinus rhythm   Ectopy:    Ectopy: none   QRS:    QRS axis:  Left   QRS intervals:  Normal   QRS conduction: LBBB   ST segments:    ST segments:  Depression   Depression:  I and aVL Comments:     Normal sinus rhythm. Regular rate. LBBB. ST depression I, aVL. No significant changes from 2023 EKG  (including critical care time)  Medications Ordered in UC Medications  aspirin  chewable tablet 324 mg (324 mg Oral Given 11/12/23 1208)    Initial Impression / Assessment and Plan / UC Course  I have reviewed the triage vital signs and the nursing notes.  Pertinent labs & imaging results that were available during my care of the patient were reviewed by me and considered in my medical decision making (see chart for  details).   83 year old female presents for moderate left-sided chest pain which has been constant over the past 24 hours.  No worsening of pain.  It has been associated with shortness of breath, fatigue and not feeling well.  Denies palpitations, leg swelling, dizziness or weakness.  No history of attack or stroke but she is fearful of a heart attack given her persistent pain.  Has not taken anything for pain relief.  Patient drove herself to the urgent care.  Vitals are stable and normal.  She appears well overall.  Heart regular rate and rhythm.  Chest is clear.  No leg swelling.  Exam overall benign.  EKG performed today shows normal sinus rhythm in a regular rate with left bundle branch block and apparent ST depression in leads I and aVL.  This compared to EKG from 2023 shows no significant changes.  Advised patient given her advanced age, persistent chest pain with other associated symptoms of shortness of breath and fatigue, abnormal EKG (which makes it difficult to tell if there is an acute MI occurring), she needs to be evaluated in the emergency department.  Advised that she should have a full cardiac workup including cardiac enzymes and likely even imaging.  Patient is reluctantly agreeable.  She initially wants to call her friend to take her to the ER but I was able to convince her to go by EMS.  We gave her 324  mg baby aspirin .  EMS was contacted and arrived promptly.  EMS gave patient nitroglycerin and transported to [facility] regional ED.  Patient left in stable condition.  High suspicion for ACS.    Final Clinical Impressions(s) / UC Diagnoses   Final diagnoses:  Chest pain, unspecified type  Shortness of breath  Other fatigue  Left bundle branch block     Discharge Instructions      You have been advised to follow up immediately in the emergency department for concerning signs.symptoms. If you declined EMS transport, please have a family member take you directly to the ED at  this time. Do not delay. Based on concerns about condition, if you do not follow up in th e ED, you may risk poor outcomes including worsening of condition, delayed treatment and potentially life threatening issues. If you have declined to go to the ED at this time, you should call your PCP immediately to set up a follow up appointment.  Go to ED for red flag symptoms, including; fevers you cannot reduce with Tylenol /Motrin, severe headaches, vision changes, numbness/weakness in part of the body, lethargy, confusion, intractable vomiting, severe dehydration, chest pain, breathing difficulty, severe persistent abdominal or pelvic pain, signs of severe infection (increased redness, swelling of an area), feeling faint or passing out, dizziness, etc. You should especially go to the ED for sudden acute worsening of condition if you do not elect to go at this time.     ED Prescriptions   None    PDMP not reviewed this encounter.   Arvis Jolan NOVAK, PA-C 11/13/23 [phone]

## 2023-11-12 NOTE — ED Notes (Signed)
 EMS called for patient transport.

## 2023-11-12 NOTE — ED Notes (Signed)
 Patient is being discharged from the Urgent Care and sent to the Emergency Department via EMS . Per [place], [place], patient is in need of higher level of care due to Chest Pain. Patient is aware and verbalizes understanding of plan of care.  Vitals:   11/12/23 1159  BP: 137/75  Pulse: 87  Resp: 14  Temp: 98 F (36.7 C)  SpO2: 97%

## 2023-11-12 NOTE — ED Triage Notes (Signed)
 Patient c/o chest pain and SOB that started yesterday.  Patient denies any numbness or tingling in her arms.  Patient denies cold symptoms.  Patient denies N/V.

## 2023-11-12 NOTE — ED Notes (Signed)
 Patient transported to X-ray

## 2023-11-12 NOTE — ED Triage Notes (Signed)
 Pt to ED with SOB/chest pain on left side that does not radiate since yesterday at urgent care. Pt is able to speak in full sentences and denies dizziness/headache at this time. No thinners/recent falls    .4 nitroglycerin 325 Aspirin   20 g from UC

## 2023-11-12 NOTE — Discharge Instructions (Signed)

## 2023-12-12 NOTE — Progress Notes (Unsigned)
  Cardiology Office Note   Date:  12/13/2023  ID:  Amber, Pollard Mar 07, 1941, MRN [id number] PCP: [facility]   HeartCare Providers Cardiologist:  Caron Poser, MD     History of Present Illness Amber Pollard is a 83 y.o. female PMH HTN, hyperthyroidism, CKD IIIa who presents for further evaluation and management of chest discomfort.  Pt presented to the ED on 11/12/23 for chest discomfort. ECG and troponin negative, ruled out for ACS.  Last LDL 120 01/2023.  Interestingly, on further review, the patient reports that she never had any chest pain.  She says she has no shortness of breath or any functional limitations whatsoever.  As far she knows, she has never had any heart issues.  Relevant CVD History -CTPA 09/2021 moderate aortic arch atherosclerosis and moderate LM/LAD CAC -TTE normal LVEF, mild LVH, trace MR/TR 12/2012   ROS: Pt denies any chest discomfort, jaw pain, arm pain, palpitations, syncope, presyncope, orthopnea, PND, or LE edema.  Studies Reviewed I have independently reviewed the patient's ECG, blood work, and prior medical records.  Physical Exam VS:  BP (!) 160/80   Pulse 80   Ht 5' 1 (1.549 m)   Wt 127 lb 12.8 oz (58 kg)   SpO2 96%   BMI 24.15 kg/m        Wt Readings from Last 3 Encounters:  12/13/23 127 lb 12.8 oz (58 kg)  11/12/23 121 lb 14.6 oz (55.3 kg)  10/28/22 122 lb (55.3 kg)    GEN: No acute distress. NECK: No JVD; No carotid bruits. CARDIAC: RRR, no murmurs, rubs, gallops. RESPIRATORY:  Clear to auscultation. EXTREMITIES:  Warm and well-perfused. No edema.  ASSESSMENT AND PLAN LBBB Patient presents for further evaluation of chest discomfort, though she tells me that she never had any chest pain and has absolutely no symptoms whatsoever.  She has a left bundle branch block which has been present since at least 2023.  Given her lack of symptoms, we will hold off on any further CAD testing but we will look further into her  LBBB.  Plan: - Echocardiogram given LBBB - If she ever develops any dyspnea or chest pain and or other symptoms suggestive of CAD, then we will obtain a CCTA to further stratify her  Aortic atherosclerosis Moderate CAC HLD Prior CT scan has shown significant aortic arch atherosclerosis as well as at least moderate coronary artery calcium  in her LM/LAD.  Given this, her LDL goal should be less than 70 and we should put her on medical therapy.  Plan: - Start ASA 81 mg daily - Start Crestor  10 mg daily; if tolerated after few days, uptitrate to 20 mg daily  5.   HTN Patient was hypertensive in the ED and recently started on some antihypertensives.  As above, she is functionally doing very well without any symptoms.  She showed me a home BP log with intermittent high blood pressures but many normal ones.  At her age, would avoid being overly aggressive with antihypertensives.  Plan: - Discussed proper blood pressure measurement technique; advised her to do this at home over the next couple of weeks and record her Bps - If she is persistently hypertensive with proper measurements, then we can adjust her regimen.       Dispo: RTC 6 months for lipid recheck  Signed, Caron Poser, MD

## 2023-12-13 ENCOUNTER — Ambulatory Visit

## 2023-12-13 VITALS — BP 160/80 | HR 80 | Ht 61.0 in | Wt 127.8 lb

## 2023-12-13 DIAGNOSIS — I2510 Atherosclerotic heart disease of native coronary artery without angina pectoris: Secondary | ICD-10-CM | POA: Diagnosis present

## 2023-12-13 DIAGNOSIS — E782 Mixed hyperlipidemia: Secondary | ICD-10-CM | POA: Diagnosis present

## 2023-12-13 DIAGNOSIS — R079 Chest pain, unspecified: Secondary | ICD-10-CM | POA: Insufficient documentation

## 2023-12-13 DIAGNOSIS — I10 Essential (primary) hypertension: Secondary | ICD-10-CM | POA: Diagnosis not present

## 2023-12-13 DIAGNOSIS — I447 Left bundle-branch block, unspecified: Secondary | ICD-10-CM | POA: Diagnosis present

## 2023-12-13 DIAGNOSIS — I700 Atherosclerosis of aorta: Secondary | ICD-10-CM | POA: Diagnosis present

## 2023-12-13 MED ORDER — ROSUVASTATIN CALCIUM 10 MG PO TABS: 10.0000 mg | ORAL_TABLET | Freq: Every day | ORAL | 2 refills | Status: AC

## 2023-12-13 MED ORDER — ASPIRIN 81 MG PO TBEC: 81.0000 mg | DELAYED_RELEASE_TABLET | Freq: Every day | ORAL | Status: AC

## 2023-12-13 NOTE — Patient Instructions (Signed)
 Medication Instructions:  Your physician recommends the following medication changes.  START TAKING:  Aspirin  81 mg by mouth once daily Crestor  10 mg by mouth once daily x 5 days, if no side effects and able to tolerate, increase dose to 20 mg by  mouth once daily (recommended in evening/bedtime)  Continue all other presribed medications as ordered  *If you need a refill on your cardiac medications before your next appointment, please call your pharmacy*  Lab Work:  No labs ordered today   If you have labs (blood work) drawn today and your tests are completely normal, you will receive your results only by: MyChart Message (if you have MyChart) OR A paper copy in the mail If you have any lab test that is abnormal or we need to change your treatment, we will call you to review the results.  Testing/Procedures:  Your physician has requested that you have an echocardiogram. Echocardiography is a painless test that uses sound waves to create images of your heart. It provides your doctor with information about the size and shape of your heart and how well your heart's chambers and valves are working.   You may receive an ultrasound enhancing agent through an IV if needed to better visualize your heart during the echo. This procedure takes approximately one hour.  There are no restrictions for this procedure.  This will take place at [address] [facility] [facility], [place] [address]  Please note: We ask at that you not bring children with you during ultrasound (echo/ vascular) testing. Due to room size and safety concerns, children are not allowed in the ultrasound rooms during exams. Our front office staff cannot provide observation of children in our lobby area while testing is being conducted. An adult accompanying a patient to their appointment will only be allowed in the ultrasound room at the discretion of the ultrasound technician under special circumstances. We  apologize for any inconvenience.   Follow-Up: At [facility], you and your health needs are our priority.  As part of our continuing mission to provide you with exceptional heart care, our providers are all part of one team.  This team includes your primary Cardiologist (physician) and Advanced Practice Providers or APPs (Physician Assistants and Nurse Practitioners) who all work together to provide you with the care you need, when you need it.  Your next appointment:   6 month(s)  Provider:   Caron Poser, MD   We recommend signing up for the patient portal called MyChart.  Sign up information is provided on this After Visit Summary.  MyChart is used to connect with patients for Virtual Visits (Telemedicine).  Patients are able to view lab/test results, encounter notes, upcoming appointments, etc.  Non-urgent messages can be sent to your provider as well.   To learn more about what you can do with MyChart, go to [email or web address].

## 2023-12-27 ENCOUNTER — Emergency Department

## 2023-12-27 ENCOUNTER — Emergency Department
Admission: EM | Admit: 2023-12-27 | Discharge: 2023-12-27 | Disposition: A | Discharge status: Home / Self Care | Attending: Emergency Medicine | Admitting: Emergency Medicine

## 2023-12-27 ENCOUNTER — Telehealth: Payer: Self-pay

## 2023-12-27 ENCOUNTER — Other Ambulatory Visit: Payer: Self-pay

## 2023-12-27 DIAGNOSIS — E039 Hypothyroidism, unspecified: Secondary | ICD-10-CM | POA: Insufficient documentation

## 2023-12-27 DIAGNOSIS — I129 Hypertensive chronic kidney disease with stage 1 through stage 4 chronic kidney disease, or unspecified chronic kidney disease: Secondary | ICD-10-CM | POA: Insufficient documentation

## 2023-12-27 DIAGNOSIS — R0789 Other chest pain: Secondary | ICD-10-CM | POA: Insufficient documentation

## 2023-12-27 DIAGNOSIS — N189 Chronic kidney disease, unspecified: Secondary | ICD-10-CM | POA: Diagnosis not present

## 2023-12-27 DIAGNOSIS — R079 Chest pain, unspecified: Secondary | ICD-10-CM

## 2023-12-27 LAB — BASIC METABOLIC PANEL WITH GFR
Anion gap: 9 (ref 5–15)
BUN: 16 mg/dL (ref 8–23)
CO2: 24 mmol/L (ref 22–32)
Calcium: 9.6 mg/dL (ref 8.9–10.3)
Chloride: 104 mmol/L (ref 98–111)
Creatinine, Ser: 1 mg/dL (ref 0.44–1.00)
GFR, Estimated: 57 mL/min — ABNORMAL LOW (ref >60)
Glucose, Bld: 114 mg/dL — ABNORMAL HIGH (ref 70–99)
Potassium: 4.5 mmol/L (ref 3.5–5.1)
Sodium: 137 mmol/L (ref 135–145)

## 2023-12-27 LAB — CBC
HCT: 37.3 % (ref 36.0–46.0)
Hemoglobin: 12.7 g/dL (ref 12.0–15.0)
MCH: 29.1 pg (ref 26.0–34.0)
MCHC: 34 g/dL (ref 30.0–36.0)
MCV: 85.6 fL (ref 80.0–100.0)
Platelets: 314 K/uL (ref 150–400)
RBC: 4.36 MIL/uL (ref 3.87–5.11)
RDW: 13.2 % (ref 11.5–15.5)
WBC: 7.5 K/uL (ref 4.0–10.5)
nRBC: 0 % (ref 0.0–0.2)

## 2023-12-27 LAB — TROPONIN I (HIGH SENSITIVITY): Troponin I (High Sensitivity): 8 ng/L (ref <18)

## 2023-12-27 MED ORDER — IOHEXOL 350 MG/ML SOLN
75.0000 mL | Freq: Once | INTRAVENOUS | Status: AC | PRN
  Administered 2023-12-27: 75 mL via INTRAVENOUS

## 2023-12-27 MED ORDER — SODIUM CHLORIDE 0.9 % IV BOLUS
500.0000 mL | Freq: Once | INTRAVENOUS | Status: AC
  Administered 2023-12-27: 500 mL via INTRAVENOUS

## 2023-12-27 NOTE — ED Provider Notes (Signed)
 [facility] Provider Note    Event Date/Time   First MD Initiated Contact with Patient 12/27/23 1248     (approximate)  History   Chief Complaint: Chest Pain  HPI  Amber Pollard is a 83 y.o. female with a past medical history of CKD, hypertension, hypothyroidism, presents to the emergency department for left-sided chest pain.  According to the patient since last night she has been experiencing pain in the left chest.  Patient states it is mostly in the left lower chest and worse with certain positions or taking a deep breath.  Denies any cough congestion or fever.  No shortness of breath.  No history of MI cardiac or pulmonary disease.  Physical Exam   Triage Vital Signs: ED Triage Vitals [12/27/23 1110]  Encounter Vitals Group     BP (!) 149/109     Girls Systolic BP Percentile      Girls Diastolic BP Percentile      Boys Systolic BP Percentile      Boys Diastolic BP Percentile      Pulse Rate 92     Resp 18     Temp 98.1 F (36.7 C)     Temp Source Oral     SpO2 96 %     Weight      Height      Head Circumference      Peak Flow      Pain Score 5     Pain Loc      Pain Education      Exclude from Growth Chart     Most recent vital signs: Vitals:   12/27/23 1110  BP: (!) 149/109  Pulse: 92  Resp: 18  Temp: 98.1 F (36.7 C)  SpO2: 96%    General: Awake, no distress.  CV:  Good peripheral perfusion.  Regular rate and rhythm  Resp:  Normal effort.  Equal breath sounds bilaterally.  Mild left lower lateral chest wall tenderness to palpation. Abd:  No distention.  Soft, nontender.  Benign abdomen. ED Results / Procedures / Treatments   EKG  EKG viewed and interpreted by myself shows a normal sinus rhythm at 86 bpm with a widened QRS, left axis deviation, largely normal intervals, nonspecific ST changes.  RADIOLOGY  I have reviewed interpret the chest x-ray images.  No consolidation on my evaluation. Radiology has read the chest  x-ray as negative.   MEDICATIONS ORDERED IN ED: Medications - No data to display   IMPRESSION / MDM / ASSESSMENT AND PLAN / ED COURSE  I reviewed the triage vital signs and the nursing notes.  Patient's presentation is most consistent with acute presentation with potential threat to life or bodily function.  Patient presents emergency department for left lateral chest wall pain worse with inspiration ongoing since last night.  States it waxes and wanes in severity but has been present.  Worse with deep inspiration worse with certain movements or positions.  Patient's workup today shows a reassuring chest x-ray.  EKG shows no significant finding.  Patient's lab work shows a reassuring CBC, reassuring chemistry and a negative troponin.  However given the patient's pleuritic nature to her pain we will proceed with a CTA of the chest to rule out PE.  If the CTA is negative I suspect that this is more likely chest wall discomfort.  Patient agreeable to plan of care and workup.  Patient's workup is reassuring with a normal CBC chemistry and troponin.  CTA  of the chest has resulted showing normal results as well.  Given the patient's reassuring workup I believe the patient is safe for discharge home with outpatient follow-up highly suspect more chest wall discomfort.  Patient and family agreeable to plan.  FINAL CLINICAL IMPRESSION(S) / ED DIAGNOSES   Chest pain    Note:  This document was prepared using Dragon voice recognition software and may include unintentional dictation errors.   Dorothyann Drivers, MD 12/27/23 [phone]

## 2023-12-27 NOTE — Telephone Encounter (Signed)
 Returned pt's call at 9:45 a.m.; pt reports that she is having Left Side chest pain; stated it started last night (9/15) after taking her medications; she laid down and it eventually subsided after 2 hours and she went to sleep; pt states that the pain came back this morning and is more intense when she takes a deep breath, but pain is still prominent only on the left side; pt denies dizziness, shortness of breath, left arm pain or back pain; she states her BP 164/? (Couldn't recall the diastolic pressure) and she has not measured her HR; she called the clinic because she wanted to know if it could be related to her new medications.  I explained without a thorough work up and an EKG it would be hard to determine the cause of her CP and I instructed her to call 911 or go to Emergency Department; pt was in agreement and stated that she will have a neighbor drive her.  I told her we would follow up with her after her ED visit.

## 2023-12-27 NOTE — Discharge Instructions (Addendum)
 Please use Tylenol  1000 mg every 6 hours as needed for discomfort.  Please follow-up with your doctor in the next several days for recheck/reevaluation.  Return to the emergency department for any worsening pain or any other symptom concerning to yourself.

## 2023-12-27 NOTE — Telephone Encounter (Signed)
  Pt c/o of Chest Pain: STAT if active CP, including tightness, pressure, jaw pain, radiating pain to shoulder/upper arm/back, CP unrelieved by Nitro. Symptoms reported of SOB, nausea, vomiting, sweating.  1. Are you having CP right now?   Yes   2. Are you experiencing any other symptoms (ex. SOB, nausea, vomiting, sweating)?   No  3. Is your CP continuous or coming and going?  Continuous  4. Have you taken Nitroglycerin?   No  5. How long have you been experiencing CP?   Started this morning when she woke up    6. If NO CP at time of call then end call with telling Pt to call back or call 911 if Chest pain returns prior to return call from triage team.   Patient stated the chest pain started last night but went away a couple of hours later before she went to bed.  Patient stated she woke up with the pain again.  No response received in Triage.

## 2023-12-27 NOTE — ED Triage Notes (Signed)
 Pt to ED via POV from home. Pt reports left sided CP that radiates to left rib cage x2 days. Pt reports SOB especially with inspiration. Pt also reports this morning left arm feel a little numb.

## 2023-12-27 NOTE — ED Notes (Signed)
 See triage note  presents with  pain to left lateral rib/chest area States pain started 2 days ago    Became worse last pm

## 2023-12-28 ENCOUNTER — Ambulatory Visit: Attending: Student | Admitting: Student

## 2023-12-28 NOTE — Progress Notes (Deleted)
   Cardiology Clinic Note   Date: 12/28/2023 ID: Amber Pollard, DOB [birth year], MRN [id number]  Primary Cardiologist:  Caron Poser, MD  Chief Complaint   Amber Pollard is a 83 y.o. female who presents to the clinic today for ***  Patient Profile   Amber Pollard is followed by Dr. Poser for the history outlined below.      Past medical history significant for: Chest pain.  LBBB. Hypertension. Hyperlipidemia.  Lipid panel 02/02/2023: LDL 120, HDL 49, TG 185, total 206. Hyperthyroidism.  CKD stage IIIa.  In summary, patient was first evaluated by Dr. Poser on 12/13/2023 for chest discomfort. She had been evaluated in the ED for chest discomfort with an unremarkable workup. Upon further conversation with patient, she revealed that she had never had chest pain, shortness of breath or functional limitations. She was noted to have LBBB on EKG dating back to at least 2023. Decision was made to order echo but hold off on ischemic evaluation since she was completely asymptomatic.  She was started on rosuvastatin .  Patient was seen in the ED on 12/27/2023 for left sided chest pain with dyspnea x 2 days. Pain worsened with certain movement and deep breathing. CTA chest was negative for PE.  Troponin negative x 1.  Patient was discharged home to follow-up with cardiology as an outpatient.     History of Present Illness    Today, patient ***  Chest pain/LBBB Patient*** - Pending echo 01/25/2024. - Schedule coronary CTA***  Hypertension BP today*** - Continue amlodipine, losartan .  Hyperlipidemia LDL 120 October 2024.  Patient reports she is tolerating rosuvastatin *** - Continue rosuvastatin *** - Repeat lipid panel and LFTs in 1 month.  ROS: All other systems reviewed and are otherwise negative except as noted in History of Present Illness.  EKGs/Labs Reviewed        12/27/2023: BUN 16; Creatinine, Ser 1.00; Potassium 4.5; Sodium 137   12/27/2023: Hemoglobin 12.7; WBC 7.5    No results found for requested labs within last 365 days.   No results found for requested labs within last 365 days.  ***  Risk Assessment/Calculations    {Does this patient have ATRIAL FIBRILLATION?:[phone]}          Physical Exam    VS:  There were no vitals taken for this visit. , BMI There is no height or weight on file to calculate BMI.  GEN: Well nourished, well developed, in no acute distress. Neck: No JVD or carotid bruits. Cardiac: *** RRR. *** No murmur. No rubs or gallops.   Respiratory:  Respirations regular and unlabored. Clear to auscultation without rales, wheezing or rhonchi. GI: Soft, nontender, nondistended. Extremities: Radials/DP/PT 2+ and equal bilaterally. No clubbing or cyanosis. No edema ***  Skin: Warm and dry, no rash. Neuro: Strength intact.  Assessment & Plan   ***  Disposition: ***     {Are you ordering a CV Procedure (e.g. stress test, cath, DCCV, TEE, etc)?   Press F2        :[id number]}   Signed, Barnie HERO. Laderrick Wilk, DNP, NP-C

## 2024-01-25 ENCOUNTER — Other Ambulatory Visit

## 2024-02-02 ENCOUNTER — Ambulatory Visit

## 2024-02-02 ENCOUNTER — Ambulatory Visit: Payer: Self-pay

## 2024-02-02 DIAGNOSIS — I447 Left bundle-branch block, unspecified: Secondary | ICD-10-CM | POA: Insufficient documentation

## 2024-02-02 DIAGNOSIS — R079 Chest pain, unspecified: Secondary | ICD-10-CM

## 2024-02-02 LAB — ECHOCARDIOGRAM COMPLETE
AV Mean grad: 3 mmHg
AV Peak grad: 4.7 mmHg
Ao pk vel: 1.08 m/s
Area-P 1/2: 3.77 cm2

## 2024-05-10 ENCOUNTER — Ambulatory Visit

## 2024-05-18 ENCOUNTER — Ambulatory Visit

## 2024-05-18 ENCOUNTER — Ambulatory Visit: Payer: Self-pay

## 2024-05-18 DIAGNOSIS — R079 Chest pain, unspecified: Secondary | ICD-10-CM

## 2024-05-18 LAB — ECHOCARDIOGRAM LIMITED
AV Mean grad: 3 mmHg
AV Peak grad: 6 mmHg
Ao pk vel: 1.22 m/s
Area-P 1/2: 2.84 cm2
MV M vel: 5.03 m/s
MV Peak grad: 101.2 mmHg
S' Lateral: 2.88 cm

## 2024-06-11 ENCOUNTER — Ambulatory Visit
